# Patient Record
Sex: Female | Born: 1935 | Race: Black or African American | Hispanic: No | Marital: Single | Smoking: Never smoker
Health system: Southern US, Community
[De-identification: ages and names within clinical notes are randomized; demographics above are authoritative.]

## PROBLEM LIST (undated history)

## (undated) DIAGNOSIS — H409 Unspecified glaucoma: Secondary | ICD-10-CM

## (undated) DIAGNOSIS — I10 Essential (primary) hypertension: Secondary | ICD-10-CM

## (undated) DIAGNOSIS — M1990 Unspecified osteoarthritis, unspecified site: Secondary | ICD-10-CM

## (undated) DIAGNOSIS — E7800 Pure hypercholesterolemia, unspecified: Secondary | ICD-10-CM

## (undated) DIAGNOSIS — E0590 Thyrotoxicosis, unspecified without thyrotoxic crisis or storm: Secondary | ICD-10-CM

## (undated) HISTORY — DX: Essential (primary) hypertension: I10

## (undated) HISTORY — PX: CATARACT EXTRACTION: SUR2

## (undated) HISTORY — DX: Pure hypercholesterolemia, unspecified: E78.00

## (undated) HISTORY — DX: Thyrotoxicosis, unspecified without thyrotoxic crisis or storm: E05.90

---

## 2011-11-25 ENCOUNTER — Encounter (HOSPITAL_COMMUNITY): Payer: Self-pay | Admitting: *Deleted

## 2011-11-25 ENCOUNTER — Emergency Department (HOSPITAL_COMMUNITY): Payer: Medicare Other

## 2011-11-25 ENCOUNTER — Observation Stay (HOSPITAL_COMMUNITY): Payer: Medicare Other

## 2011-11-25 ENCOUNTER — Observation Stay (HOSPITAL_COMMUNITY)
Admission: EM | Admit: 2011-11-25 | Discharge: 2011-11-27 | Disposition: A | Discharge status: Home / Self Care | Payer: Medicare Other | Attending: Internal Medicine | Admitting: Internal Medicine

## 2011-11-25 DIAGNOSIS — R011 Cardiac murmur, unspecified: Secondary | ICD-10-CM | POA: Diagnosis present

## 2011-11-25 DIAGNOSIS — H409 Unspecified glaucoma: Secondary | ICD-10-CM | POA: Diagnosis present

## 2011-11-25 DIAGNOSIS — I079 Rheumatic tricuspid valve disease, unspecified: Secondary | ICD-10-CM | POA: Insufficient documentation

## 2011-11-25 DIAGNOSIS — R5383 Other fatigue: Secondary | ICD-10-CM | POA: Insufficient documentation

## 2011-11-25 DIAGNOSIS — R42 Dizziness and giddiness: Principal | ICD-10-CM | POA: Diagnosis present

## 2011-11-25 DIAGNOSIS — R110 Nausea: Secondary | ICD-10-CM | POA: Insufficient documentation

## 2011-11-25 DIAGNOSIS — R5381 Other malaise: Secondary | ICD-10-CM | POA: Insufficient documentation

## 2011-11-25 DIAGNOSIS — E0590 Thyrotoxicosis, unspecified without thyrotoxic crisis or storm: Secondary | ICD-10-CM | POA: Insufficient documentation

## 2011-11-25 DIAGNOSIS — I10 Essential (primary) hypertension: Secondary | ICD-10-CM | POA: Insufficient documentation

## 2011-11-25 DIAGNOSIS — J329 Chronic sinusitis, unspecified: Secondary | ICD-10-CM | POA: Insufficient documentation

## 2011-11-25 HISTORY — DX: Unspecified osteoarthritis, unspecified site: M19.90

## 2011-11-25 HISTORY — DX: Unspecified glaucoma: H40.9

## 2011-11-25 LAB — APTT: aPTT: 34 seconds (ref 24–37)

## 2011-11-25 LAB — HEPATIC FUNCTION PANEL
ALT: 12 U/L (ref 0–35)
Bilirubin, Direct: <0.1 mg/dL (ref 0.0–0.3)
Total Protein: 7.4 g/dL (ref 6.0–8.3)

## 2011-11-25 LAB — CBC WITH DIFFERENTIAL/PLATELET
Eosinophils Absolute: 0 10*3/uL (ref 0.0–0.7)
Eosinophils Relative: 1 % (ref 0–5)
Lymphs Abs: 0.8 10*3/uL (ref 0.7–4.0)
MCH: 27 pg (ref 26.0–34.0)
MCV: 81.4 fL (ref 78.0–100.0)
Monocytes Relative: 5 % (ref 3–12)
Platelets: 167 10*3/uL (ref 150–400)
RBC: 5.37 MIL/uL — ABNORMAL HIGH (ref 3.87–5.11)

## 2011-11-25 LAB — URINE MICROSCOPIC-ADD ON

## 2011-11-25 LAB — COMPREHENSIVE METABOLIC PANEL
BUN: 15 mg/dL (ref 6–23)
Calcium: 9.3 mg/dL (ref 8.4–10.5)
GFR calc Af Amer: >90 mL/min (ref >90)
Glucose, Bld: 138 mg/dL — ABNORMAL HIGH (ref 70–99)
Total Protein: 7.3 g/dL (ref 6.0–8.3)

## 2011-11-25 LAB — URINALYSIS, ROUTINE W REFLEX MICROSCOPIC
Nitrite: NEGATIVE
Specific Gravity, Urine: 1.014 (ref 1.005–1.030)
pH: 7.5 (ref 5.0–8.0)

## 2011-11-25 LAB — MAGNESIUM: Magnesium: 2.1 mg/dL (ref 1.5–2.5)

## 2011-11-25 LAB — PHOSPHORUS: Phosphorus: 2.5 mg/dL (ref 2.3–4.6)

## 2011-11-25 LAB — PROTIME-INR: INR: 0.98 (ref 0.00–1.49)

## 2011-11-25 MED ORDER — BRIMONIDINE TARTRATE 0.1 % OP SOLN: 1.0000 [drp] | Freq: Two times a day (BID) | OPHTHALMIC | Status: DC

## 2011-11-25 MED ORDER — HYDRALAZINE HCL 20 MG/ML IJ SOLN: 10.0000 mg | Freq: Three times a day (TID) | INTRAMUSCULAR | Status: DC | PRN

## 2011-11-25 MED ORDER — SODIUM CHLORIDE 0.9 % IV SOLN
INTRAVENOUS | Status: DC
  Administered 2011-11-25: 21:00:00 via INTRAVENOUS
  Administered 2011-11-26: 20 mL/h via INTRAVENOUS

## 2011-11-25 MED ORDER — TRAVOPROST (BAK FREE) 0.004 % OP SOLN
1.0000 [drp] | Freq: Every day | OPHTHALMIC | Status: DC
  Administered 2011-11-25 – 2011-11-26 (×2): 1 [drp] via OPHTHALMIC
  Filled 2011-11-25: qty 2.5

## 2011-11-25 MED ORDER — LISINOPRIL 10 MG PO TABS: 10.0000 mg | ORAL_TABLET | Freq: Every day | ORAL | Status: DC

## 2011-11-25 MED ORDER — AMLODIPINE BESYLATE 5 MG PO TABS
5.0000 mg | ORAL_TABLET | Freq: Every day | ORAL | Status: DC
  Administered 2011-11-25 – 2011-11-26 (×2): 5 mg via ORAL
  Filled 2011-11-25 (×2): qty 1

## 2011-11-25 MED ORDER — BRIMONIDINE TARTRATE 0.15 % OP SOLN
1.0000 [drp] | Freq: Two times a day (BID) | OPHTHALMIC | Status: DC
  Administered 2011-11-25 – 2011-11-27 (×4): 1 [drp] via OPHTHALMIC
  Filled 2011-11-25: qty 5

## 2011-11-25 MED ORDER — ENOXAPARIN SODIUM 40 MG/0.4ML ~~LOC~~ SOLN
40.0000 mg | SUBCUTANEOUS | Status: DC
  Administered 2011-11-25 – 2011-11-26 (×2): 40 mg via SUBCUTANEOUS
  Filled 2011-11-25 (×3): qty 0.4

## 2011-11-25 MED ORDER — ACETAMINOPHEN 325 MG PO TABS: 650.0000 mg | ORAL_TABLET | Freq: Four times a day (QID) | ORAL | Status: DC | PRN

## 2011-11-25 MED ORDER — MECLIZINE HCL 25 MG PO TABS
25.0000 mg | ORAL_TABLET | Freq: Once | ORAL | Status: AC
  Administered 2011-11-25: 25 mg via ORAL
  Filled 2011-11-25: qty 1

## 2011-11-25 MED ORDER — MECLIZINE HCL 50 MG PO TABS: 25.0000 mg | ORAL_TABLET | Freq: Three times a day (TID) | ORAL | Status: AC | PRN

## 2011-11-25 MED ORDER — ONDANSETRON HCL 4 MG/2ML IJ SOLN: 4.0000 mg | Freq: Four times a day (QID) | INTRAMUSCULAR | Status: DC | PRN

## 2011-11-25 MED ORDER — ACETAMINOPHEN 650 MG RE SUPP: 650.0000 mg | Freq: Four times a day (QID) | RECTAL | Status: DC | PRN

## 2011-11-25 MED ORDER — LABETALOL HCL 5 MG/ML IV SOLN
10.0000 mg | Freq: Once | INTRAVENOUS | Status: AC
Start: 2011-11-25 — End: 2011-11-25
  Administered 2011-11-25: 10 mg via INTRAVENOUS
  Filled 2011-11-25: qty 4

## 2011-11-25 MED ORDER — ASPIRIN EC 81 MG PO TBEC
81.0000 mg | DELAYED_RELEASE_TABLET | Freq: Every day | ORAL | Status: DC
  Administered 2011-11-26 – 2011-11-27 (×2): 81 mg via ORAL
  Filled 2011-11-25 (×2): qty 1

## 2011-11-25 MED ORDER — DORZOLAMIDE HCL-TIMOLOL MAL 2-0.5 % OP SOLN
1.0000 [drp] | Freq: Two times a day (BID) | OPHTHALMIC | Status: DC
  Administered 2011-11-25 – 2011-11-27 (×4): 1 [drp] via OPHTHALMIC
  Filled 2011-11-25: qty 10

## 2011-11-25 MED ORDER — ONDANSETRON HCL 4 MG PO TABS: 4.0000 mg | ORAL_TABLET | Freq: Four times a day (QID) | ORAL | Status: DC | PRN

## 2011-11-25 MED ORDER — CARBOXYMETHYLCELLULOSE SODIUM 0.5 % OP SOLN: 1.0000 [drp] | Freq: Three times a day (TID) | OPHTHALMIC | Status: DC | PRN

## 2011-11-25 MED ORDER — LABETALOL HCL 5 MG/ML IV SOLN
10.0000 mg | Freq: Once | INTRAVENOUS | Status: AC
  Administered 2011-11-25: 10 mg via INTRAVENOUS
  Filled 2011-11-25: qty 4

## 2011-11-25 MED ORDER — POLYVINYL ALCOHOL 1.4 % OP SOLN
1.0000 [drp] | Freq: Three times a day (TID) | OPHTHALMIC | Status: DC | PRN
  Administered 2011-11-25: 1 [drp] via OPHTHALMIC
  Filled 2011-11-25: qty 15

## 2011-11-25 NOTE — ED Notes (Signed)
ZOX:[id number]<AV> Expected date:<BR> Expected time: 8:41 AM<BR> Means of arrival:Ambulance<BR> Comments:<BR> Elderly, weakness

## 2011-11-25 NOTE — ED Notes (Signed)
In process of discharging pt, pt sat up, stated dizzy again, family states pt does not need to be discharged, states "she is not herself, she needs to atleast stay over night". EDP to be made aware.

## 2011-11-25 NOTE — ED Provider Notes (Addendum)
History     CSN: [id number]  Arrival date & time 11/25/11  0865   First MD Initiated Contact with Patient 11/25/11 [phone]      Chief Complaint  Patient presents with  . Weakness  . Emesis  . Nausea    (Consider location/radiation/quality/duration/timing/severity/associated sxs/prior treatment) HPI Pt states she felt at her baseline last night. She woke this AM and got out of bed. States she feels the bed was moving. She sat back down and she began to feel better. States she now feels generally weak. No fever, chills, HA, visual changes, CP, SOB, abd pain, N/V/D or urinary symptoms. Pt has history of HTN but was taken off BP meds Past Medical History  Diagnosis Date  . Arthritis     knees  . Glaucoma     Past Surgical History  Procedure Date  . Cataract extraction     History reviewed. No pertinent family history.  History  Substance Use Topics  . Smoking status: Never Smoker   . Smokeless tobacco: Never Used  . Alcohol Use: No    OB History    Grav Para Term Preterm Abortions TAB SAB Ect Mult Living                  Review of Systems  Constitutional: Negative for fever and chills.  HENT: Negative for neck pain.   Eyes: Negative for visual disturbance.  Respiratory: Negative for shortness of breath.   Cardiovascular: Negative for chest pain.  Gastrointestinal: Negative for nausea, vomiting and abdominal pain.  Genitourinary: Negative for dysuria, frequency and flank pain.  Musculoskeletal: Negative for back pain.  Skin: Negative for rash and wound.  Neurological: Positive for dizziness, weakness and light-headedness. Negative for syncope, numbness and headaches.    Allergies  Review of patient's allergies indicates no known allergies.  Home Medications   Current Outpatient Rx  Name Route Sig Dispense Refill  . BRIMONIDINE TARTRATE 0.1 % OP SOLN Both Eyes Place 1 drop into both eyes 2 (two) times daily.    Marland Kitchen CARBOXYMETHYLCELLULOSE SODIUM 0.5 % OP SOLN  Both Eyes Place 1 drop into both eyes 3 (three) times daily as needed.    . DORZOLAMIDE HCL-TIMOLOL MAL 22.3-6.8 MG/ML OP SOLN Both Eyes Place 1 drop into both eyes 2 (two) times daily.    . TRAVOPROST (BAK FREE) 0.004 % OP SOLN Both Eyes Place 1 drop into both eyes at bedtime.    Marland Kitchen LISINOPRIL 10 MG PO TABS Oral Take 1 tablet (10 mg total) by mouth daily. 30 tablet 2  . MECLIZINE HCL 50 MG PO TABS Oral Take 0.5 tablets (25 mg total) by mouth 3 (three) times daily as needed. 30 tablet 0    BP 160/60  Pulse 59  Temp 97.4 F (36.3 C) (Oral)  Resp 21  SpO2 99%  Physical Exam  Nursing note and vitals reviewed. Constitutional: She is oriented to person, place, and time. She appears well-developed and well-nourished. No distress.  HENT:  Head: Normocephalic and atraumatic.  Mouth/Throat: Oropharynx is clear and moist.  Eyes: EOM are normal. Pupils are equal, round, and reactive to light.       No nystagmus  Neck: Normal range of motion. Neck supple.  Cardiovascular: Normal rate and regular rhythm.   Murmur heard. Pulmonary/Chest: Effort normal and breath sounds normal. No respiratory distress. She has no wheezes. She has no rales.  Abdominal: Soft. Bowel sounds are normal. There is no tenderness. There is no rebound and no  guarding.  Musculoskeletal: Normal range of motion. She exhibits no edema and no tenderness.  Neurological: She is alert and oriented to person, place, and time.       5/5 motor in all ext, sensation intact, finger to nose intact  Skin: Skin is warm and dry. No rash noted. No erythema.  Psychiatric: She has a normal mood and affect. Her behavior is normal.    ED Course  Procedures (including critical care time)  Labs Reviewed  GLUCOSE, CAPILLARY - Abnormal; Notable for the following:    Glucose-Capillary 110 (*)     All other components within normal limits  CBC WITH DIFFERENTIAL - Abnormal; Notable for the following:    RBC 5.37 (*)     Neutrophils Relative 79  (*)     All other components within normal limits  COMPREHENSIVE METABOLIC PANEL - Abnormal; Notable for the following:    Glucose, Bld 138 (*)     GFR calc non Af Amer 80 (*)     All other components within normal limits  URINALYSIS, ROUTINE W REFLEX MICROSCOPIC - Abnormal; Notable for the following:    Glucose, UA 100 (*)     Leukocytes, UA TRACE (*)     All other components within normal limits  URINE MICROSCOPIC-ADD ON - Abnormal; Notable for the following:    Bacteria, UA FEW (*)     All other components within normal limits  PROTIME-INR  APTT  POCT I-STAT TROPONIN I   Ct Head Wo Contrast  11/25/2011  *RADIOLOGY REPORT*  Clinical Data: Dizziness and weakness.  CT HEAD WITHOUT CONTRAST  Technique:  Contiguous axial images were obtained from the base of the skull through the vertex without contrast.  Comparison: None.  Findings: Bone windows demonstrate minimal mucosal thickening of the maxillary sinuses.  Hypoplastic frontal sinuses.  Partial opacification of the inferior aspect of the left mastoid air cells; image 4 of series 3.  Soft tissue windows demonstrate normal cerebral volume for age. Mild low density in the periventricular white matter likely related to small vessel disease. No  mass lesion, hemorrhage, hydrocephalus, acute infarct, intra-axial, or extra-axial fluid collection.  IMPRESSION:  1. No acute intracranial abnormality. 2.  Relatively mild for age small vessel ischemic change. 3.  Fluid in the left mastoid air cells.   Original Report Authenticated By: Consuello Bossier, M.D.      1. Hypertension   2. Vertigo      Date: 11/25/2011  Rate: 61  Rhythm: normal sinus rhythm  QRS Axis: normal  Intervals: QRS prolonged  ST/T Wave abnormalities: nonspecific T wave changes  Conduction Disutrbances:right bundle branch block  Narrative Interpretation:   Old EKG Reviewed: none available    MDM  Pt with persistent dizziness when sitting up and nausea. Will MRI for  possible post fossa CVA. Discussed with [facility] who will admit to obs.        Loren Racer, MD 11/25/11 1544  Loren Racer, MD 11/25/11 [phone]

## 2011-11-25 NOTE — Progress Notes (Signed)
Carol Munoz, is a 78 y.o. female,   MRN: [id number]  -  DOB - 08-Jun-1924  Outpatient Primary MD for the patient is No primary provider on file.  in for    Chief Complaint  Patient presents with  . Weakness  . Emesis  . Nausea     Blood pressure 166/73, pulse 61, temperature 97.4 F (36.3 C), temperature source Oral, resp. rate 15, SpO2 100.00%.  Principal Problem:  *Vertigo  78 yo female with virtually no medical hx presents to ED w/cc that she "does not feel well" reports sensation bed moving this am. States that when she moves she feels dizzy. No CP,palpitation, sob, fever, chills. No recent nausea/vomiting. No diarrhea  Work up yields BP 210/63 initially, other vitals WNL, CT head no acute intracranial abnormality.  Relatively mild for age small vessel ischemic change.  Fluid in the left mastoid air cells.  Pt given labetalol x2 in ED, and meclizine. Symptoms improved. About to discharge from ED and pt complained of dizziness again and family concerned that she not herself. Getting MRI  Have admitted for obs to tele, if MRI neg can change to MS.

## 2011-11-25 NOTE — ED Notes (Signed)
Pt states she woke up this morning "not feeling like herself", states she feels weak and dizzy, states felt fine before going to bed last night, denies pain, shortness of breath or numbness/tingling, states has not put her eye drops in this morning so her vision is blurred d/t that, that is not new. Pt states she has not eaten anything today. Pt denies having nausea and vomiting at this time, had nausea and vomiting when arrived with ems. Pt a/o x 4, follows all commands appropriately.

## 2011-11-25 NOTE — ED Notes (Signed)
Patient transported to MRI 

## 2011-11-25 NOTE — H&P (Signed)
[facility] History and Physical  Carol Munoz ZOX:[id number] DOB: 04/04/1924 DOA: 11/25/2011  Referring physician: Dr. Ranae Palms PCP: No primary provider on file.   Chief Complaint: generalized weakness, dizziness and nausea.  HPI: Carol Munoz is a 78 y.o. female with pmh significant for HTN and glaucoma who came to the hospital complaining of dizziness and generalized weakness with associated nausea. Patient reports some dizziness when changing position after waking up on the day of admission; subsequently she felt weak and reports some nausea, but no vomiting. Patient was feeling so bad that she decide to call 911. Paramedics arrived and patient came to ED; while in ED she was found with BP of 210/102 that responded to 2 doses of labetalol; patient had CT scan and MRI w/o acute abnormalities; plan was to discharge patient from ED; but she felt dizzy again and per family was acting different. [facility] was consulted to admit patient for further evaluation and tx.  Patient denies CP, SOB, abdominal pain, diarrhea, hematemesis, melena, HA, blurred vision, fever, chills and/or cough.  Review of Systems: negative except as mentioned on HPI.  Past Medical History  Diagnosis Date  . Arthritis     knees  . Glaucoma    Past Surgical History  Procedure Date  . Cataract extraction    Social History:  reports that she has never smoked. She has never used smokeless tobacco. She reports that she does not drink alcohol or use illicit drugs. Patient came from home and was able to perform ADL's w/o assistance  No Known Allergies  History reviewed. No pertinent family history. per patient just hx of HTN on her parents.   Prior to Admission medications   Medication Sig Start Date End Date Taking? Authorizing Provider  brimonidine (ALPHAGAN P) 0.1 % SOLN Place 1 drop into both eyes 2 (two) times daily.   Yes Historical Provider, MD  carboxymethylcellulose (REFRESH PLUS) 0.5 % SOLN Place 1 drop into  both eyes 3 (three) times daily as needed.   Yes Historical Provider, MD  dorzolamide-timolol (COSOPT) 22.3-6.8 MG/ML ophthalmic solution Place 1 drop into both eyes 2 (two) times daily.   Yes Historical Provider, MD  Travoprost, BAK Free, (TRAVATAN) 0.004 % SOLN ophthalmic solution Place 1 drop into both eyes at bedtime.   Yes Historical Provider, MD  lisinopril (PRINIVIL) 10 MG tablet Take 1 tablet (10 mg total) by mouth daily. 11/25/11 11/24/12  Loren Racer, MD  meclizine (ANTIVERT) 50 MG tablet Take 0.5 tablets (25 mg total) by mouth 3 (three) times daily as needed. 11/25/11 12/05/11  Loren Racer, MD   Physical Exam: Filed Vitals:   11/25/11 1530 11/25/11 1615 11/25/11 1744 11/25/11 1900  BP: 160/60 166/73 158/49 173/47  Pulse: 59 61 83 73  Temp:   98.7 F (37.1 C) 98 F (36.7 C)  TempSrc:   Oral   Resp: 21 15 25 20   Height:    5\' 3"  (1.6 m)  Weight:    60.283 kg (132 lb 14.4 oz)  SpO2: 99% 100% 100% 95%     General:NAD; AAOX3, cooperative to examination  Eyes: PERRLA, no icterus, no nystagmus; EOMI  ENT: MMM; no erythema or exudates inside her mouth  Neck: no bruits  Cardiovascular: RRR; no rubs or gallops; positive SEM  Respiratory: CTA bilaterally  Abdomen: soft, NT, ND positive BS  Skin: no ras or petechiae  Musculoskeletal: no joint swelling or erythema; FROM  Psychiatric: no hallucinations or SI; appropriate  Neurologic: AAOX3; non focal deficit; CN intact,  2+ DTR; normal finger to nose; MS 5/5 bilaterally.  Labs on Admission:  Basic Metabolic Panel:  Lab 11/25/11 0454  NA 136  K 3.8  CL 102  CO2 24  GLUCOSE 138*  BUN 15  CREATININE 0.63  CALCIUM 9.3  MG --  PHOS --   Liver Function Tests:  Lab 11/25/11 0950  AST 18  ALT 11  ALKPHOS 64  BILITOT 0.4  PROT 7.3  ALBUMIN 3.9   CBC:  Lab 11/25/11 0950  WBC 5.0  NEUTROABS 3.9  HGB 14.5  HCT 43.7  MCV 81.4  PLT 167   CBG:  Lab 11/25/11 0917  GLUCAP 110*    Radiological Exams  on Admission: Ct Head Wo Contrast  11/25/2011  *RADIOLOGY REPORT*  Clinical Data: Dizziness and weakness.  CT HEAD WITHOUT CONTRAST  Technique:  Contiguous axial images were obtained from the base of the skull through the vertex without contrast.  Comparison: None.  Findings: Bone windows demonstrate minimal mucosal thickening of the maxillary sinuses.  Hypoplastic frontal sinuses.  Partial opacification of the inferior aspect of the left mastoid air cells; image 4 of series 3.  Soft tissue windows demonstrate normal cerebral volume for age. Mild low density in the periventricular white matter likely related to small vessel disease. No  mass lesion, hemorrhage, hydrocephalus, acute infarct, intra-axial, or extra-axial fluid collection.  IMPRESSION:  1. No acute intracranial abnormality. 2.  Relatively mild for age small vessel ischemic change. 3.  Fluid in the left mastoid air cells.   Original Report Authenticated By: Consuello Bossier, M.D.    Mr Brain Wo Contrast  11/25/2011  *RADIOLOGY REPORT*  Clinical Data: 78 year old female with vertigo.  Dizziness and weakness.  MRI HEAD WITHOUT CONTRAST  Technique:  Multiplanar, multiecho pulse sequences of the brain and surrounding structures were obtained according to standard protocol without intravenous contrast.  Comparison: Head CT without contrast 11/25/2011.  Findings: No restricted diffusion to suggest acute infarction.  No ventriculomegaly. No midline shift, mass effect, or evidence of mass lesion. No acute intracranial hemorrhage identified. Negative pituitary and cervicomedullary junction. 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  Widespread patchy and confluent cerebral white matter T2 and FLAIR hyperintensity. No areas of cortical encephalomalacia identified.  Deep gray matter nuclei and brain stem within normal limits.  There may be a tiny chronic lacunar infarct in the left cerebellar  hemisphere (series 5 image 5).  Negative visualized cervical spine.  Normal marrow signal. Postoperative changes to the right globe.  Minimal paranasal sinus mucosal thickening. Negative scalp soft tissues. Left mastoid air cell fluid, more so inferiorly.  Negative visualized nasopharynx. Right mastoids are clear.  Asymmetric right petrous apex pneumatization incidentally noted. Other visualized internal auditory structures appear grossly normal.  IMPRESSION: 1. No acute intracranial abnormality. 2.  Moderate nonspecific white matter changes, favor related to chronic small vessel disease. 3.  Left mastoid effusion, significance doubtful.   Original Report Authenticated By: Ulla Potash III, M.D.     EKG: regular rate; normal PR interval and axis; no acute ischemic changes.  Assessment/Plan: 1-Vertigo: most likely BPV vs TIA due to uncontrolled malignant HTN. Will ask PT to evaluate for vestibular problems and also start prn meclizine. CT and MRI negative for acute abnormalities. Will follow VS and start tx for HTN. Will use ASA 81 mg for secondary prevention given concern for TIA.  2-HTN (hypertension), malignant:will start tx with norvasc; will also start low sodium  diet. PRN hydralazine for SBP > 185 or DBP > 105.  3-Nausea:might be associated with BPV; will start PRN antiemetics.  4-Glaucoma:continue eye drops. No vision changes reported per patient.  5-Systolic murmur:most likely 2/2 uncontrolled HTN and aortic stenosis. Will check 2-D echo.  DVT: lovenox.   Code Status: Full Family Communication: whole family at bedside Disposition Plan: home when medically stable  Time spent: >30 minutes  Carol Munoz [facility] Pager [phone]  If 7PM-7AM, please contact night-coverage [email or web address] Password [facility] 11/25/2011, 7:19 PM

## 2011-11-25 NOTE — ED Notes (Signed)
Pt ambulated to restroom w/o difficulty, no dizziness present.

## 2011-11-25 NOTE — ED Notes (Signed)
Per EMS pt from home, woke up this morning "not feeling herself", feeling weak, dizzy when stood up, negative for orthostatics, nausea and vomiting when pulled in parking lot, denies any new pain, has arthritis pain. A/o. BP 138/72, HR 74, CBG 111

## 2011-11-26 DIAGNOSIS — J329 Chronic sinusitis, unspecified: Secondary | ICD-10-CM | POA: Diagnosis present

## 2011-11-26 DIAGNOSIS — E0590 Thyrotoxicosis, unspecified without thyrotoxic crisis or storm: Secondary | ICD-10-CM | POA: Diagnosis present

## 2011-11-26 LAB — CBC
Hemoglobin: 12.9 g/dL (ref 12.0–15.0)
MCH: 26.7 pg (ref 26.0–34.0)
Platelets: 157 10*3/uL (ref 150–400)
RBC: 4.83 MIL/uL (ref 3.87–5.11)
WBC: 6.1 10*3/uL (ref 4.0–10.5)

## 2011-11-26 LAB — LIPID PANEL
Total CHOL/HDL Ratio: 3.9 RATIO
VLDL: 24 mg/dL (ref 0–40)

## 2011-11-26 LAB — BASIC METABOLIC PANEL
CO2: 26 mEq/L (ref 19–32)
Calcium: 9 mg/dL (ref 8.4–10.5)
Chloride: 109 mEq/L (ref 96–112)
Glucose, Bld: 99 mg/dL (ref 70–99)
Potassium: 3.1 mEq/L — ABNORMAL LOW (ref 3.5–5.1)
Sodium: 143 mEq/L (ref 135–145)

## 2011-11-26 LAB — TSH: TSH: 0.209 u[IU]/mL — ABNORMAL LOW (ref 0.350–4.500)

## 2011-11-26 MED ORDER — AZITHROMYCIN 500 MG PO TABS
500.0000 mg | ORAL_TABLET | Freq: Every day | ORAL | Status: AC
  Administered 2011-11-26: 500 mg via ORAL
  Filled 2011-11-26: qty 1

## 2011-11-26 MED ORDER — AZITHROMYCIN 250 MG PO TABS
250.0000 mg | ORAL_TABLET | Freq: Every day | ORAL | Status: DC
  Administered 2011-11-27: 250 mg via ORAL
  Filled 2011-11-26: qty 1

## 2011-11-26 MED ORDER — AMLODIPINE BESYLATE 10 MG PO TABS
10.0000 mg | ORAL_TABLET | Freq: Every day | ORAL | Status: DC
  Administered 2011-11-27: 10 mg via ORAL
  Filled 2011-11-26: qty 1

## 2011-11-26 NOTE — Evaluation (Signed)
Physical Therapy Evaluation Patient Details Name: Carol Munoz MRN: [id number] DOB: Aug 06, 1924 Today's Date: 11/26/2011 Time: 6295-2841 PT Time Calculation (min): 27 min  PT Assessment / Plan / Recommendation Clinical Impression  Pt presents with vertigo and nausea with history of HTN and glaucoma.  Pt has normal smooth persuits, no saccades noted during head thrust test and no nystagmus or c/o dizziness with head turns side to side or getting in/out of bed.  No evidence of peripheral/central vestibular issues noted other than some mild balance deficits. Ambulated with pt in hallway with no AD with some noted instability/imbalance.  Pt will benefit from skilled PT in acute venue to address deficits.  PT recommends OP Neurorehabilitation for balance and fall prevention training.  Provided pt with script to fill out with PCP.      PT Assessment  Patient needs continued PT services    Follow Up Recommendations  Outpatient PT    Barriers to Discharge None      Equipment Recommendations  None recommended by PT    Recommendations for Other Services     Frequency Min 3X/week    Precautions / Restrictions Precautions Precautions: Fall Restrictions Weight Bearing Restrictions: No   Pertinent Vitals/Pain No pain      Mobility  Bed Mobility Bed Mobility: Supine to Sit Supine to Sit: 7: Independent Transfers Transfers: Sit to Stand;Stand to Sit Sit to Stand: With upper extremity assist;From bed;6: Modified independent (Device/Increase time) Stand to Sit: 6: Modified independent (Device/Increase time);With upper extremity assist;To bed;With armrests;To chair/3-in-1 Details for Transfer Assistance: Performed transfer x 2 at Mod I level for increased time Ambulation/Gait Ambulation/Gait Assistance: 4: Min guard;4: Min assist Ambulation Distance (Feet): 400 Feet Assistive device: None Ambulation/Gait Assistance Details: Cues for safety with ambulation.  Min guard to min assist for noted  intermittent instability/LOB.   Gait Pattern: Step-through pattern Gait velocity: WFL Stairs: Yes Stairs Assistance: 4: Min assist Stairs Assistance Details (indicate cue type and reason): cues for sequencing/technique.  Stair Management Technique: One rail Right;Forwards;Step to pattern Number of Stairs: 3  Wheelchair Mobility Wheelchair Mobility: No    Exercises     PT Diagnosis: Difficulty walking  PT Problem List: Decreased balance;Decreased mobility PT Treatment Interventions: Gait training;Stair training;Functional mobility training;Therapeutic activities;Therapeutic exercise;Balance training;Patient/family education   PT Goals Acute Rehab PT Goals PT Goal Formulation: With patient Time For Goal Achievement: 12/03/11 Potential to Achieve Goals: Good Pt will Ambulate: Independently;51 - 150 feet PT Goal: Ambulate - Progress: Goal set today Pt will Go Up / Down Stairs: 3-5 stairs;with modified independence;with rail(s) PT Goal: Up/Down Stairs - Progress: Goal set today Additional Goals Additional Goal #1: Score 16/19 on DGI PT Goal: Additional Goal #1 - Progress: Goal set today  Visit Information  Last PT Received On: 11/26/11 Assistance Needed: +1    Subjective Data  Subjective: Yes I was feeling dizzy, but better now Patient Stated Goal: to go home.    Prior Functioning  Home Living Lives With: Alone Available Help at Discharge: Family;Available PRN/intermittently Type of Home: House Home Access: Stairs to enter [employer] of Steps: 5 Entrance Stairs-Rails: Right;Left Home Layout: One level Bathroom Shower/Tub: Engineer, manufacturing systems: Handicapped height Home Adaptive Equipment: Straight cane;Grab bars in shower Prior Function Level of Independence: Independent Able to Take Stairs?: Yes Driving: Yes Vocation: Retired Musician: No difficulties    Cognition  Overall Cognitive Status: Appears within functional limits  for tasks assessed/performed Arousal/Alertness: Awake/alert Orientation Level: Appears intact for tasks assessed Behavior During  Session: [facility] for tasks performed    Extremity/Trunk Assessment Right Lower Extremity Assessment RLE ROM/Strength/Tone: WFL for tasks assessed RLE Sensation: WFL - Light Touch RLE Coordination: WFL - gross/fine motor Left Lower Extremity Assessment LLE ROM/Strength/Tone: WFL for tasks assessed LLE Sensation: WFL - Light Touch LLE Coordination: WFL - gross/fine motor Trunk Assessment Trunk Assessment: Normal   Balance Balance Balance Assessed: Yes Standardized Balance Assessment Standardized Balance Assessment: Vestibular Evaluation;Dynamic Gait Index Dynamic Gait Index Level Surface: Mild Impairment Change in Gait Speed: Mild Impairment Gait with Horizontal Head Turns: Mild Impairment Gait with Vertical Head Turns: Moderate Impairment Gait and Pivot Turn: Moderate Impairment Step Over Obstacle: Moderate Impairment Step Around Obstacles: Mild Impairment Steps: Moderate Impairment Total Score: 12   End of Session PT - End of Session Equipment Utilized During Treatment: Gait belt Activity Tolerance: Patient tolerated treatment well Patient left: in chair;with call bell/phone within reach;with family/visitor present Nurse Communication: Mobility status  GP Functional Assessment Tool Used: DGI and clinical judgement.  Functional Limitation: Mobility: Walking and moving around Mobility: Walking and Moving Around Current Status [phone]): At least 20 percent but less than 40 percent impaired, limited or restricted Mobility: Walking and Moving Around Goal Status [phone]): At least 1 percent but less than 20 percent impaired, limited or restricted   Lessie Dings 11/26/2011, 12:41 PM

## 2011-11-26 NOTE — Progress Notes (Signed)
Nutrition Brief Note  Patient identified on the Malnutrition Screening Tool (MST) report for unintended weight loss, generating a score of 2.   Body mass index is 23.54 kg/(m^2). Pt meets criteria for healthy normal weight based on current BMI.   Current diet order is heart healthy, patient is consuming approximately 75% of meals at this time. Labs and medications reviewed. Pt reports eating well PTA, 3 meals/day and snacks. Pt reports some weight loss in the past year r/t pt eating healthier and cutting back on junk food. Pt states she went from a size 14 to a size 12. Pt reports she had some nausea/vomiting on admission which has resolved.   No nutrition interventions warranted at this time. If nutrition issues arise, please consult RD.   Levon Hedger MS, RD, LDN [phone] Pager [phone] After Hours Pager

## 2011-11-26 NOTE — Progress Notes (Signed)
  Echocardiogram 2D Echocardiogram has been performed.  Carol Munoz 11/26/2011, 10:28 AM

## 2011-11-26 NOTE — Progress Notes (Signed)
[facility] PROGRESS NOTE  Carol Munoz UJW:[id number] DOB: 1924-10-27 DOA: 11/25/2011 PCP: No primary provider on file.  Assessment/Plan: Principal Problem:  *Vertigo Active Problems:  HTN (hypertension), malignant  Nausea  Glaucoma  Systolic murmur    1. Vertigo: Patient presented with vertigo, and nausea, precipitated/exacerbated by movement. Differential diagnostic considerations included BPV vs TIA vs hypertensive encephalopathy, due to uncontrolled malignant HTN. BP was 210/102 on initial evaluation. Head CT and brain MRI were negative for  negative for acute abnormalities. She was managed with Meclizine, underwent vestibular rehab, by PT/OT, and is a symptomatic as of 11/26/11. Placed on ASA 81 mg for secondary prevention of CVA. Vascular duplex and 2D Echocardiogram are pending. .  2. HTN (hypertension), malignant: As described above, BP was markedly elevated at presentation, and was likely contributory to presenting symptomatology. This was addressed with iv Labetalol, followed by scheduled Norvasc, and has vastly improved. Will increase Norvasc further, to 10 mg daily, today. On PRN Hydralazine for SBP > 185 or DBP > 105.  3. Glaucoma: Not problematic. Continued on pre-admission eye drops.  5. Systolic murmur: This was an incidental finding on physical exam. Evaluation with 2-D echo. 6. Acute Sinusitis: Fluid-filled left mastoid sinus, was revealed on brain imaging studies. We shall address with a course of Azithromycin.  7. Dysthyroidism: TSH is low at 0.209, consistent with hyperthyroidism. We shall check T3 and T4. As patient has a bradycardia of 66/min at this time, we shall not utilize beta-blockade. She will need outpatient follow up.    Code Status: Full Code.  Family Communication:  Disposition Plan: To be determined.    Brief narrative: 78 y.o. female with pmh significant for HTN, OA and glaucoma who presented with dizziness and generalized weakness with associated  nausea, but without vomiting, commencing when changing position after waking up on the day of admission. Patient was feeling so bad, that she decide to call 911. Paramedics arrived and patient came to ED; while in ED she was found with BP of 210/102 that responded to 2 doses of iv Labetalol. Head CT scan and brain MRI showed no acute abnormalities. The plan was to discharge patient from ED; but she felt dizzy again and per family, was acting different. [facility] was consulted to admit patient for further evaluation and treatment.      Consultants:  N/A.  Procedures:  Head CT scan.  Brain MRI.   Antibiotics:  N/A.   HPI/Subjective: Asymptomatic.   Objective: Vital signs in last 24 hours: Temp:  [97.7 F (36.5 C)-98.7 F (37.1 C)] 98.2 F (36.8 C) (09/12 5621) Pulse Rate:  [59-83] 66  (09/12 0638) Resp:  [13-25] 16  (09/12 0638) BP: (148-173)/(46-73) 148/46 mmHg (09/12 0638) SpO2:  [95 %-100 %] 100 % (09/12 3086) Weight:  [60.283 kg (132 lb 14.4 oz)] 60.283 kg (132 lb 14.4 oz) (09/11 1900) Weight change:  Last BM Date: 11/25/11  Intake/Output from previous day: 09/11 0701 - 09/12 0700 In: 204 [I.V.:204] Out: -  Total I/O In: 240 [P.O.:240] Out: -    Physical Exam: General: Comfortable, alert, communicative, fully oriented, not short of breath at rest.  HEENT:  No clinical pallor, no jaundice, no conjunctival injection or discharge. Hydration is satisfactory.  NECK:  Supple, JVP not seen, no carotid bruits, no palpable lymphadenopathy, no palpable goiter. CHEST:  Clinically clear to auscultation, no wheezes, no crackles. HEART:  Sounds 1 and 2 heard, normal, regular, systolic murmurs. ABDOMEN:  Full, soft, non-tender, no palpable organomegaly, no palpable  masses, normal bowel sounds. GENITALIA:  Not examined. LOWER EXTREMITIES:  No pitting edema, palpable peripheral pulses. MUSCULOSKELETAL SYSTEM:  Generalized osteoarthritic changes, otherwise, normal. CENTRAL NERVOUS  SYSTEM:  No focal neurologic deficit on gross examination.  Lab Results:  Basename 11/26/11 0352 11/25/11 0950  WBC 6.1 5.0  HGB 12.9 14.5  HCT 39.5 43.7  PLT 157 167    Basename 11/26/11 0352 11/25/11 0950  NA 143 136  K 3.1* 3.8  CL 109 102  CO2 26 24  GLUCOSE 99 138*  BUN 12 15  CREATININE 0.70 0.63  CALCIUM 9.0 9.3   No results found for this or any previous visit (from the past 240 hour(s)).   Studies/Results: Ct Head Wo Contrast  11/25/2011  *RADIOLOGY REPORT*  Clinical Data: Dizziness and weakness.  CT HEAD WITHOUT CONTRAST  Technique:  Contiguous axial images were obtained from the base of the skull through the vertex without contrast.  Comparison: None.  Findings: Bone windows demonstrate minimal mucosal thickening of the maxillary sinuses.  Hypoplastic frontal sinuses.  Partial opacification of the inferior aspect of the left mastoid air cells; image 4 of series 3.  Soft tissue windows demonstrate normal cerebral volume for age. Mild low density in the periventricular white matter likely related to small vessel disease. No  mass lesion, hemorrhage, hydrocephalus, acute infarct, intra-axial, or extra-axial fluid collection.  IMPRESSION:  1. No acute intracranial abnormality. 2.  Relatively mild for age small vessel ischemic change. 3.  Fluid in the left mastoid air cells.   Original Report Authenticated By: Consuello Bossier, M.D.    Mr Brain Wo Contrast  11/25/2011  *RADIOLOGY REPORT*  Clinical Data: 78 year old female with vertigo.  Dizziness and weakness.  MRI HEAD WITHOUT CONTRAST  Technique:  Multiplanar, multiecho pulse sequences of the brain and surrounding structures were obtained according to standard protocol without intravenous contrast.  Comparison: Head CT without contrast 11/25/2011.  Findings: No restricted diffusion to suggest acute infarction.  No ventriculomegaly. No midline shift, mass effect, or evidence of mass lesion. No acute intracranial hemorrhage  identified. Negative pituitary and cervicomedullary junction. 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  Widespread patchy and confluent cerebral white matter T2 and FLAIR hyperintensity. No areas of cortical encephalomalacia identified.  Deep gray matter nuclei and brain stem within normal limits.  There may be a tiny chronic lacunar infarct in the left cerebellar hemisphere (series 5 image 5).  Negative visualized cervical spine.  Normal marrow signal. Postoperative changes to the right globe.  Minimal paranasal sinus mucosal thickening. Negative scalp soft tissues. Left mastoid air cell fluid, more so inferiorly.  Negative visualized nasopharynx. Right mastoids are clear.  Asymmetric right petrous apex pneumatization incidentally noted. Other visualized internal auditory structures appear grossly normal.  IMPRESSION: 1. No acute intracranial abnormality. 2.  Moderate nonspecific white matter changes, favor related to chronic small vessel disease. 3.  Left mastoid effusion, significance doubtful.   Original Report Authenticated By: Harley Hallmark, M.D.     Medications: Scheduled Meds:   . amLODipine  5 mg Oral Daily  . aspirin EC  81 mg Oral Daily  . brimonidine  1 drop Both Eyes BID  . dorzolamide-timolol  1 drop Both Eyes BID  . enoxaparin (LOVENOX) injection  40 mg Subcutaneous Q24H  . labetalol  10 mg Intravenous Once  . meclizine  25 mg Oral Once  . Travoprost (BAK Free)  1 drop Both Eyes QHS  .  DISCONTD: brimonidine  1 drop Both Eyes BID   Continuous Infusions:   . sodium chloride 20 mL/hr at 11/25/11 2118   PRN Meds:.acetaminophen, acetaminophen, hydrALAZINE, ondansetron (ZOFRAN) IV, ondansetron, polyvinyl alcohol, DISCONTD: carboxymethylcellulose    LOS: 1 day   Yennifer Segovia,CHRISTOPHER  [facility] Pager [phone]. If 8PM-8AM, please contact night-coverage at [email or web address], password [facility] 11/26/2011, 12:09 PM  LOS:  1 day

## 2011-11-26 NOTE — Discharge Summary (Signed)
Physician Discharge Summary  Carol Munoz WUJ:[id number] DOB: 04-17-1924 DOA: 11/25/2011  PCP: No primary provider on file.  Admit date: 11/25/2011 Discharge date: 11/27/2011  Recommendations for Outpatient Follow-up:  1. Follow up with primary MD. 2. Primary MD to monitor thyroid function. 3. Neurorehabilitation, for balance and fall prevention training.   Discharge Diagnoses:  Principal Problem:  *Vertigo Active Problems:  HTN (hypertension), malignant  Nausea  Glaucoma  Systolic murmur  Sinusitis  Hyperthyroidism   Discharge Condition: Satisfactory.   Diet recommendation: Heart-Healthy.   Filed Weights   11/25/11 1900  Weight: 60.283 kg (132 lb 14.4 oz)    History of present illness:  78 y.o. female with history of HTN, OA and glaucoma who presented with dizziness and generalized weakness with associated nausea, but without vomiting, commencing when changing position after waking up on the day of admission. Patient was feeling so bad, that she decide to call 911. Paramedics arrived and patient came to ED; while in ED she was found with BP of 210/102 that responded to 2 doses of iv Labetalol. Head CT scan and brain MRI showed no acute abnormalities. The plan was to discharge patient from ED; but she felt dizzy again and per family, was acting different. [facility] was consulted to admit patient for further evaluation and treatment.    Hospital Course:  1. Vertigo: Patient presented with vertigo, and nausea, precipitated/exacerbated by movement. Differential diagnostic considerations included BPV vs TIA vs hypertensive encephalopathy, due to uncontrolled malignant HTN. BP was 210/102 on initial evaluation. Head CT and brain MRI were negative for negative for acute abnormalities. She was managed with Meclizine, underwent vestibular rehab, by PT/OT, and was asymptomatic as of 11/26/11. Patient was placed on ASA 81 mg, for secondary prevention of CVA. 2D Echocardiogram showed normal left  ventricular cavity size, EF of 65% to 70% and no regional wall motion abnormalities. There was grade 1 diastolic dysfunction. Aortic valve mobility was trivially restricted, with very mild stenosis. Mild regurgitation. Valve area: 1.74cm^2(VTI). Valve area: 1.5cm^2 (Vmax). Valve area: 1.63cm^2 (Vmean). Left atrium was mildly to moderately dilated. PA peak pressure: 38mm Hg (S). Vascular dopplers showed no evidence of hemodynamically significant internal carotid artery stenosis bilateral. Vertebral arteries are patent with antegrade flow.  2. HTN (hypertension), malignant: As described above, BP was markedly elevated at presentation, and was likely contributory to presenting symptomatology. This was addressed with iv Labetalol, followed by scheduled Norvasc, with satisfactory control.  3. Glaucoma: Not problematic. Continued on pre-admission eye drops.  5. Systolic murmur: This was an incidental finding on physical exam and was evaluated with 2-D echo. See findings as described above.  6. Acute Sinusitis: Fluid-filled left mastoid sinus, was revealed on brain imaging studies. Patient has been treated with a 5-day course of Azithromycin, to be completed on 11/30/11.  7. Dysthyroidism: TSH was low at 0.209, free T4 was normal at 1.16, as was total T3 at 121.2, consistent with subclinical hyperthyroidism. As patient has a bradycardia of 63-66/min at this time, beta-blockade was not instituted. She will need outpatient follow up of her thyroid tests, by PMD, at his own discretion. .    Procedures:  See below.  Consultations:  N/A.   Discharge Exam: Filed Vitals:   11/26/11 9562 11/26/11 1314 11/26/11 2108 11/26/11 2121  BP: 148/46 152/54  120/62  Pulse: 66 63 63   Temp: 98.2 F (36.8 C) 97.7 F (36.5 C) 98.5 F (36.9 C)   TempSrc: Oral  Oral   Resp: 16 18 16  Height:      Weight:      SpO2: 100% 97% 100%     General: Comfortable, alert, communicative, fully oriented, not short of breath at  rest.  HEENT: No clinical pallor, no jaundice, no conjunctival injection or discharge. Hydration is satisfactory.  NECK: Supple, JVP not seen, no carotid bruits, no palpable lymphadenopathy, no palpable goiter.  CHEST: Clinically clear to auscultation, no wheezes, no crackles.  HEART: Sounds 1 and 2 heard, normal, regular, systolic murmurs.  ABDOMEN: Full, soft, non-tender, no palpable organomegaly, no palpable masses, normal bowel sounds.  GENITALIA: Not examined.  LOWER EXTREMITIES: No pitting edema, palpable peripheral pulses.  MUSCULOSKELETAL SYSTEM: Generalized osteoarthritic changes, otherwise, normal.  CENTRAL NERVOUS SYSTEM: No focal neurologic deficit on gross examination.  Discharge Instructions      Discharge Orders    Future Orders Please Complete By Expires   Diet - low sodium heart healthy      Increase activity slowly          Medication List     As of 11/27/2011 11:08 AM    TAKE these medications         amLODipine 10 MG tablet   Commonly known as: NORVASC   Take 1 tablet (10 mg total) by mouth daily.      aspirin 81 MG EC tablet   Take 1 tablet (81 mg total) by mouth daily.      azithromycin 250 MG tablet   Commonly known as: ZITHROMAX   Take 1 tablet (250 mg total) by mouth daily. MAY ADMINISTER WITHOUT REGARD TO MEALS      brimonidine 0.1 % Soln   Commonly known as: ALPHAGAN P   Place 1 drop into both eyes 2 (two) times daily.      carboxymethylcellulose 0.5 % Soln   Commonly known as: REFRESH PLUS   Place 1 drop into both eyes 3 (three) times daily as needed.      dorzolamide-timolol 22.3-6.8 MG/ML ophthalmic solution   Commonly known as: COSOPT   Place 1 drop into both eyes 2 (two) times daily.      lisinopril 10 MG tablet   Commonly known as: PRINIVIL,ZESTRIL   Take 1 tablet (10 mg total) by mouth daily.      meclizine 50 MG tablet   Commonly known as: ANTIVERT   Take 0.5 tablets (25 mg total) by mouth 3 (three) times daily as needed.        Travoprost (BAK Free) 0.004 % Soln ophthalmic solution   Commonly known as: TRAVATAN   Place 1 drop into both eyes at bedtime.         Follow-up Information    Follow up with BURNETT,BRENT A, MD. In 1 week.   Contact information:   [address] [place] [address] [phone]           The results of significant diagnostics from this hospitalization (including imaging, microbiology, ancillary and laboratory) are listed below for reference.    Significant Diagnostic Studies: Ct Head Wo Contrast  11/25/2011  *RADIOLOGY REPORT*  Clinical Data: Dizziness and weakness.  CT HEAD WITHOUT CONTRAST  Technique:  Contiguous axial images were obtained from the base of the skull through the vertex without contrast.  Comparison: None.  Findings: Bone windows demonstrate minimal mucosal thickening of the maxillary sinuses.  Hypoplastic frontal sinuses.  Partial opacification of the inferior aspect of the left mastoid air cells; image 4 of series 3.  Soft tissue windows demonstrate normal cerebral  volume for age. Mild low density in the periventricular white matter likely related to small vessel disease. No  mass lesion, hemorrhage, hydrocephalus, acute infarct, intra-axial, or extra-axial fluid collection.  IMPRESSION:  1. No acute intracranial abnormality. 2.  Relatively mild for age small vessel ischemic change. 3.  Fluid in the left mastoid air cells.   Original Report Authenticated By: Consuello Bossier, M.D.    Mr Brain Wo Contrast  11/25/2011  *RADIOLOGY REPORT*  Clinical Data: 78 year old female with vertigo.  Dizziness and weakness.  MRI HEAD WITHOUT CONTRAST  Technique:  Multiplanar, multiecho pulse sequences of the brain and surrounding structures were obtained according to standard protocol without intravenous contrast.  Comparison: Head CT without contrast 11/25/2011.  Findings: No restricted diffusion to suggest acute infarction.  No ventriculomegaly. No midline shift, mass effect, or  evidence of mass lesion. No acute intracranial hemorrhage identified. Negative pituitary and cervicomedullary junction. 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  Widespread patchy and confluent cerebral white matter T2 and FLAIR hyperintensity. No areas of cortical encephalomalacia identified.  Deep gray matter nuclei and brain stem within normal limits.  There may be a tiny chronic lacunar infarct in the left cerebellar hemisphere (series 5 image 5).  Negative visualized cervical spine.  Normal marrow signal. Postoperative changes to the right globe.  Minimal paranasal sinus mucosal thickening. Negative scalp soft tissues. Left mastoid air cell fluid, more so inferiorly.  Negative visualized nasopharynx. Right mastoids are clear.  Asymmetric right petrous apex pneumatization incidentally noted. Other visualized internal auditory structures appear grossly normal.  IMPRESSION: 1. No acute intracranial abnormality. 2.  Moderate nonspecific white matter changes, favor related to chronic small vessel disease. 3.  Left mastoid effusion, significance doubtful.   Original Report Authenticated By: Harley Hallmark, M.D.     Microbiology: No results found for this or any previous visit (from the past 240 hour(s)).   Labs: Basic Metabolic Panel:  Lab 11/27/11 1610 11/26/11 0352 11/25/11 1857 11/25/11 0950  NA 142 143 -- 136  K 3.4* 3.1* -- 3.8  CL 109 109 -- 102  CO2 27 26 -- 24  GLUCOSE 99 99 -- 138*  BUN 14 12 -- 15  CREATININE 0.80 0.70 -- 0.63  CALCIUM 8.7 9.0 -- 9.3  MG -- -- 2.1 --  PHOS -- -- 2.5 --   Liver Function Tests:  Lab 11/25/11 1857 11/25/11 0950  AST 24 18  ALT 12 11  ALKPHOS 64 64  BILITOT 0.4 0.4  PROT 7.4 7.3  ALBUMIN 4.1 3.9   No results found for this basename: LIPASE:5,AMYLASE:5 in the last 168 hours No results found for this basename: AMMONIA:5 in the last 168 hours CBC:  Lab 11/27/11 0422 11/26/11  0352 11/25/11 0950  WBC 5.2 6.1 5.0  NEUTROABS -- -- 3.9  HGB 13.3 12.9 14.5  HCT 40.3 39.5 43.7  MCV 82.6 81.8 81.4  PLT 166 157 167   Cardiac Enzymes: No results found for this basename: CKTOTAL:5,CKMB:5,CKMBINDEX:5,TROPONINI:5 in the last 168 hours BNP: BNP (last 3 results) No results found for this basename: PROBNP:3 in the last 8760 hours CBG:  Lab 11/25/11 0917  GLUCAP 110*    Time coordinating discharge: 40 minutes  Signed:  Marlyne Totaro,CHRISTOPHER  [facility] 11/27/2011, 11:08 AM

## 2011-11-27 LAB — CBC
MCV: 82.6 fL (ref 78.0–100.0)
Platelets: 166 10*3/uL (ref 150–400)
RBC: 4.88 MIL/uL (ref 3.87–5.11)
WBC: 5.2 10*3/uL (ref 4.0–10.5)

## 2011-11-27 LAB — BASIC METABOLIC PANEL
CO2: 27 mEq/L (ref 19–32)
Chloride: 109 mEq/L (ref 96–112)
Sodium: 142 mEq/L (ref 135–145)

## 2011-11-27 LAB — T3: T3, Total: 121.2 ng/dl (ref 80.0–204.0)

## 2011-11-27 LAB — T4, FREE: Free T4: 1.16 ng/dL (ref 0.80–1.80)

## 2011-11-27 MED ORDER — POTASSIUM CHLORIDE CRYS ER 20 MEQ PO TBCR
40.0000 meq | EXTENDED_RELEASE_TABLET | Freq: Once | ORAL | Status: AC
  Administered 2011-11-27: 40 meq via ORAL
  Filled 2011-11-27: qty 2

## 2011-11-27 MED ORDER — AMLODIPINE BESYLATE 10 MG PO TABS: 10.0000 mg | ORAL_TABLET | Freq: Every day | ORAL | Status: AC

## 2011-11-27 MED ORDER — ASPIRIN 81 MG PO TBEC: 81.0000 mg | DELAYED_RELEASE_TABLET | Freq: Every day | ORAL | Status: AC

## 2011-11-27 MED ORDER — AZITHROMYCIN 250 MG PO TABS: ORAL_TABLET | ORAL | Status: AC

## 2011-11-27 NOTE — Progress Notes (Signed)
*  PRELIMINARY RESULTS* Vascular Ultrasound Carotid Duplex (Doppler) has been completed.   There is no evidence of hemodynamically significant internal carotid artery stenosis bilateral. Vertebral arteries are patent with antegrade flow.  11/27/2011 10:51 AM Gertie Fey, RDMS, RDCS

## 2011-11-27 NOTE — Evaluation (Signed)
Occupational Therapy Evaluation Patient Details Name: Carol Munoz MRN: [id number] DOB: 07/06/1924 Today's Date: 11/27/2011 Time: 4098-1191 OT Time Calculation (min): 19 min  OT Assessment / Plan / Recommendation Clinical Impression  This 78 year old female was admitted for vertigo.  Pt has been mostly asymptomatic but reported one episode after midnight when sitting on commode.  Checked for BPPV and pt had mild response to L side.  Did not have more with second trial but gave HEP in case dizziness persists.  Pt will follow up with OP Neuro at their balance program.      OT Assessment  Patient needs continued OT Services    Follow Up Recommendations  Supervision/Assistance - 24 hour (OP PT balance program)    Barriers to Discharge      Equipment Recommendations  None recommended by OT;None recommended by PT    Recommendations for Other Services    Frequency  Min 2X/week    Precautions / Restrictions Precautions Precautions: Fall Restrictions Weight Bearing Restrictions: No   Pertinent Vitals/Pain No pain.  Mild dizziness during BPPV test    ADL  Transfers/Ambulation Related to ADLs: Pt's sister will help with ADLs this am.  Pt has been walking to bathroom with IV pole ADL Comments: Pt able to bend forward and put sock on.  Able to look up to ceiling.  Tested BPPV with modified Gilberto Better.  Pt had mild reaction to L side but had eyes closed most of time.  Lasted only about 10 seconds.  No reaction to R side and when retested L, no reaction. Pt plans OP PT at Neurorehab for balance. Gave Brandt-Daroff exercises if dizziness persists.  Reviewed with pt and sister, and handout given. Worked through exercise and pt verbalizes understanding    OT Diagnosis: Other (comment) (BPPV)  OT Problem List: Impaired balance (sitting and/or standing);Other (comment) (BPPV) OT Treatment Interventions: Self-care/ADL training;DME and/or AE instruction;Patient/family education;Balance  training;Therapeutic activities   OT Goals Acute Rehab OT Goals OT Goal Formulation: With patient Time For Goal Achievement: 12/04/11 Potential to Achieve Goals: Good Miscellaneous OT Goals Miscellaneous OT Goal #1: Pt will be independent with Brandt-Daroff exercises for L BPPV OT Goal: Miscellaneous Goal #1 - Progress: Goal set today Miscellaneous OT Goal #2: Pt will be mod I with basic adls  and bathroom transfers OT Goal: Miscellaneous Goal #2 - Progress: Goal set today  Visit Information  Last OT Received On: 11/27/11 Assistance Needed: +1    Subjective Data  Subjective: I got a little dizzy after midnight when I was up on the commode   Prior Functioning  Vision/Perception  Home Living Lives With: Alone Available Help at Discharge: Family;Available 24 hours/day (daughter and granddaughter) Bathroom Shower/Tub: Engineer, manufacturing systems: Handicapped height Home Adaptive Equipment: Straight cane;Grab bars in shower Prior Function Level of Independence: Independent Communication Communication: No difficulties      Cognition  Overall Cognitive Status: Appears within functional limits for tasks assessed/performed Arousal/Alertness: Awake/alert Orientation Level: Appears intact for tasks assessed Behavior During Session: [facility] for tasks performed    Extremity/Trunk Assessment Right Upper Extremity Assessment RUE ROM/Strength/Tone: [facility] for tasks assessed Left Upper Extremity Assessment LUE ROM/Strength/Tone: [facility] for tasks assessed   Mobility  Shoulder Instructions          Exercise     Balance     End of Session OT - End of Session Activity Tolerance: Patient tolerated treatment well Patient left: in bed;with call bell/phone within reach;with family/visitor present  GO Functional Assessment  Tool Used: clinical observation Functional Limitation: Self care Self Care Current Status [phone]): At least 1 percent but less than 20 percent impaired, limited or  restricted Self Care Goal Status ([id number]): 0 percent impaired, limited or restricted   Whitley Strycharz 11/27/2011, 10:49 AM Marica Otter, OTR/L [phone] 11/27/2011

## 2011-11-27 NOTE — Progress Notes (Signed)
Discharge instructions explained to patient and her sister. Prescriptions given to her sister

## 2017-04-15 ENCOUNTER — Emergency Department (HOSPITAL_COMMUNITY): Payer: Medicare Other

## 2017-04-15 ENCOUNTER — Inpatient Hospital Stay (HOSPITAL_COMMUNITY)
Admission: EM | Admit: 2017-04-15 | Discharge: 2017-04-20 | DRG: 194 | Disposition: A | Discharge status: Home Health | Payer: Medicare Other | Attending: Internal Medicine | Admitting: Internal Medicine

## 2017-04-15 ENCOUNTER — Encounter (HOSPITAL_COMMUNITY): Payer: Self-pay | Admitting: Emergency Medicine

## 2017-04-15 DIAGNOSIS — R05 Cough: Secondary | ICD-10-CM | POA: Diagnosis not present

## 2017-04-15 DIAGNOSIS — J181 Lobar pneumonia, unspecified organism: Principal | ICD-10-CM | POA: Diagnosis present

## 2017-04-15 DIAGNOSIS — E079 Disorder of thyroid, unspecified: Secondary | ICD-10-CM | POA: Diagnosis not present

## 2017-04-15 DIAGNOSIS — R7881 Bacteremia: Secondary | ICD-10-CM | POA: Diagnosis present

## 2017-04-15 DIAGNOSIS — E876 Hypokalemia: Secondary | ICD-10-CM | POA: Diagnosis present

## 2017-04-15 DIAGNOSIS — H409 Unspecified glaucoma: Secondary | ICD-10-CM | POA: Diagnosis present

## 2017-04-15 DIAGNOSIS — J189 Pneumonia, unspecified organism: Secondary | ICD-10-CM | POA: Diagnosis present

## 2017-04-15 DIAGNOSIS — E0590 Thyrotoxicosis, unspecified without thyrotoxic crisis or storm: Secondary | ICD-10-CM | POA: Diagnosis present

## 2017-04-15 DIAGNOSIS — Z79899 Other long term (current) drug therapy: Secondary | ICD-10-CM

## 2017-04-15 DIAGNOSIS — I10 Essential (primary) hypertension: Secondary | ICD-10-CM | POA: Diagnosis present

## 2017-04-15 DIAGNOSIS — J9811 Atelectasis: Secondary | ICD-10-CM | POA: Diagnosis present

## 2017-04-15 DIAGNOSIS — R059 Cough, unspecified: Secondary | ICD-10-CM

## 2017-04-15 DIAGNOSIS — B9620 Unspecified Escherichia coli [E. coli] as the cause of diseases classified elsewhere: Secondary | ICD-10-CM | POA: Diagnosis present

## 2017-04-15 LAB — COMPREHENSIVE METABOLIC PANEL
ALBUMIN: 3.4 g/dL — AB (ref 3.5–5.0)
ALK PHOS: 111 U/L (ref 38–126)
ALT: 151 U/L — ABNORMAL HIGH (ref 14–54)
ANION GAP: 10 (ref 5–15)
AST: 118 U/L — ABNORMAL HIGH (ref 15–41)
BILIRUBIN TOTAL: 2.3 mg/dL — AB (ref 0.3–1.2)
BUN: 19 mg/dL (ref 6–20)
CALCIUM: 9 mg/dL (ref 8.9–10.3)
CO2: 24 mmol/L (ref 22–32)
Chloride: 101 mmol/L (ref 101–111)
Creatinine, Ser: 0.73 mg/dL (ref 0.44–1.00)
GLUCOSE: 143 mg/dL — AB (ref 65–99)
POTASSIUM: 3 mmol/L — AB (ref 3.5–5.1)
Sodium: 135 mmol/L (ref 135–145)
TOTAL PROTEIN: 6.9 g/dL (ref 6.5–8.1)

## 2017-04-15 LAB — CBC WITH DIFFERENTIAL/PLATELET
BASOS PCT: 0 %
Basophils Absolute: 0 10*3/uL (ref 0.0–0.1)
Eosinophils Absolute: 0 10*3/uL (ref 0.0–0.7)
Eosinophils Relative: 0 %
HEMATOCRIT: 34.8 % — AB (ref 36.0–46.0)
HEMOGLOBIN: 11.3 g/dL — AB (ref 12.0–15.0)
LYMPHS ABS: 0.4 10*3/uL — AB (ref 0.7–4.0)
Lymphocytes Relative: 5 %
MCH: 26.4 pg (ref 26.0–34.0)
MCHC: 32.5 g/dL (ref 30.0–36.0)
MCV: 81.3 fL (ref 78.0–100.0)
MONO ABS: 0.6 10*3/uL (ref 0.1–1.0)
Monocytes Relative: 7 %
NEUTROS ABS: 6.6 10*3/uL (ref 1.7–7.7)
NEUTROS PCT: 88 %
Platelets: 167 10*3/uL (ref 150–400)
RBC: 4.28 MIL/uL (ref 3.87–5.11)
RDW: 15.1 % (ref 11.5–15.5)
WBC: 7.6 10*3/uL (ref 4.0–10.5)

## 2017-04-15 LAB — URINALYSIS, ROUTINE W REFLEX MICROSCOPIC
Bacteria, UA: NONE SEEN
Bilirubin Urine: NEGATIVE
GLUCOSE, UA: NEGATIVE mg/dL
Hgb urine dipstick: NEGATIVE
KETONES UR: 20 mg/dL — AB
NITRITE: NEGATIVE
PH: 7 (ref 5.0–8.0)
PROTEIN: NEGATIVE mg/dL
Specific Gravity, Urine: 1.014 (ref 1.005–1.030)

## 2017-04-15 LAB — I-STAT CG4 LACTIC ACID, ED: Lactic Acid, Venous: 0.81 mmol/L (ref 0.5–1.9)

## 2017-04-15 LAB — INFLUENZA PANEL BY PCR (TYPE A & B)
INFLAPCR: NEGATIVE
Influenza B By PCR: NEGATIVE

## 2017-04-15 LAB — TROPONIN I: Troponin I: <0.03 ng/mL (ref <0.03)

## 2017-04-15 MED ORDER — HYDROCHLOROTHIAZIDE 25 MG PO TABS
25.0000 mg | ORAL_TABLET | Freq: Every day | ORAL | Status: DC
  Administered 2017-04-16 – 2017-04-20 (×5): 25 mg via ORAL
  Filled 2017-04-15 (×5): qty 1

## 2017-04-15 MED ORDER — IPRATROPIUM-ALBUTEROL 0.5-2.5 (3) MG/3ML IN SOLN
3.0000 mL | Freq: Two times a day (BID) | RESPIRATORY_TRACT | Status: DC
  Administered 2017-04-16 – 2017-04-18 (×5): 3 mL via RESPIRATORY_TRACT
  Filled 2017-04-15 (×5): qty 3

## 2017-04-15 MED ORDER — AMLODIPINE BESYLATE 10 MG PO TABS
10.0000 mg | ORAL_TABLET | Freq: Every day | ORAL | Status: DC
Start: 2017-04-16 — End: 2017-04-20
  Administered 2017-04-16 – 2017-04-20 (×5): 10 mg via ORAL
  Filled 2017-04-15 (×5): qty 1

## 2017-04-15 MED ORDER — DEXTROSE 5 % IV SOLN: 1.0000 g | INTRAVENOUS | Status: DC

## 2017-04-15 MED ORDER — DEXTROSE 5 % IV SOLN
1.0000 g | Freq: Once | INTRAVENOUS | Status: AC
  Administered 2017-04-15: 1 g via INTRAVENOUS
  Filled 2017-04-15: qty 10

## 2017-04-15 MED ORDER — LATANOPROST 0.005 % OP SOLN
1.0000 [drp] | Freq: Every day | OPHTHALMIC | Status: DC
  Administered 2017-04-16 – 2017-04-19 (×5): 1 [drp] via OPHTHALMIC
  Filled 2017-04-15: qty 2.5

## 2017-04-15 MED ORDER — SODIUM CHLORIDE 0.9 % IV SOLN
INTRAVENOUS | Status: DC
  Administered 2017-04-15 – 2017-04-17 (×2): via INTRAVENOUS

## 2017-04-15 MED ORDER — ACETAMINOPHEN 325 MG PO TABS
650.0000 mg | ORAL_TABLET | Freq: Four times a day (QID) | ORAL | Status: DC | PRN
  Administered 2017-04-16: 650 mg via ORAL
  Filled 2017-04-15: qty 2

## 2017-04-15 MED ORDER — DORZOLAMIDE HCL-TIMOLOL MAL 2-0.5 % OP SOLN
1.0000 [drp] | Freq: Two times a day (BID) | OPHTHALMIC | Status: DC
Start: 2017-04-15 — End: 2017-04-20
  Administered 2017-04-16 – 2017-04-20 (×10): 1 [drp] via OPHTHALMIC
  Filled 2017-04-15 (×2): qty 10

## 2017-04-15 MED ORDER — LOSARTAN POTASSIUM 50 MG PO TABS
100.0000 mg | ORAL_TABLET | Freq: Every day | ORAL | Status: DC
  Administered 2017-04-16 – 2017-04-20 (×5): 100 mg via ORAL
  Filled 2017-04-15 (×5): qty 2

## 2017-04-15 MED ORDER — DEXTROSE 5 % IV SOLN
500.0000 mg | Freq: Once | INTRAVENOUS | Status: AC
  Administered 2017-04-15: 500 mg via INTRAVENOUS
  Filled 2017-04-15: qty 500

## 2017-04-15 MED ORDER — ALBUTEROL SULFATE (2.5 MG/3ML) 0.083% IN NEBU: 2.5000 mg | INHALATION_SOLUTION | RESPIRATORY_TRACT | Status: DC | PRN

## 2017-04-15 MED ORDER — ONDANSETRON HCL 4 MG/2ML IJ SOLN: 4.0000 mg | Freq: Four times a day (QID) | INTRAMUSCULAR | Status: DC | PRN

## 2017-04-15 MED ORDER — POLYVINYL ALCOHOL 1.4 % OP SOLN
2.0000 [drp] | Freq: Three times a day (TID) | OPHTHALMIC | Status: DC | PRN
  Administered 2017-04-16: 2 [drp] via OPHTHALMIC
  Filled 2017-04-15: qty 15

## 2017-04-15 MED ORDER — ACETAMINOPHEN 325 MG PO TABS
650.0000 mg | ORAL_TABLET | Freq: Once | ORAL | Status: AC
  Administered 2017-04-15: 650 mg via ORAL
  Filled 2017-04-15: qty 2

## 2017-04-15 MED ORDER — ACETAMINOPHEN 650 MG RE SUPP: 650.0000 mg | Freq: Four times a day (QID) | RECTAL | Status: DC | PRN

## 2017-04-15 MED ORDER — POTASSIUM CHLORIDE CRYS ER 20 MEQ PO TBCR
40.0000 meq | EXTENDED_RELEASE_TABLET | Freq: Once | ORAL | Status: AC
  Administered 2017-04-15: 40 meq via ORAL
  Filled 2017-04-15: qty 2

## 2017-04-15 MED ORDER — ENOXAPARIN SODIUM 40 MG/0.4ML ~~LOC~~ SOLN
40.0000 mg | Freq: Every day | SUBCUTANEOUS | Status: DC
  Administered 2017-04-16 – 2017-04-19 (×4): 40 mg via SUBCUTANEOUS
  Filled 2017-04-15 (×4): qty 0.4

## 2017-04-15 MED ORDER — ONDANSETRON HCL 4 MG PO TABS: 4.0000 mg | ORAL_TABLET | Freq: Four times a day (QID) | ORAL | Status: DC | PRN

## 2017-04-15 MED ORDER — DEXTROSE 5 % IV SOLN
500.0000 mg | INTRAVENOUS | Status: DC
  Administered 2017-04-16 – 2017-04-19 (×4): 500 mg via INTRAVENOUS
  Filled 2017-04-15 (×5): qty 500

## 2017-04-15 MED ORDER — BRIMONIDINE TARTRATE 0.15 % OP SOLN
1.0000 [drp] | Freq: Three times a day (TID) | OPHTHALMIC | Status: DC
  Administered 2017-04-16 – 2017-04-20 (×15): 1 [drp] via OPHTHALMIC
  Filled 2017-04-15: qty 5

## 2017-04-15 MED ORDER — LOSARTAN POTASSIUM-HCTZ 100-25 MG PO TABS: 1.0000 | ORAL_TABLET | Freq: Every day | ORAL | Status: DC

## 2017-04-15 MED ORDER — ALBUTEROL SULFATE (2.5 MG/3ML) 0.083% IN NEBU
2.5000 mg | INHALATION_SOLUTION | Freq: Once | RESPIRATORY_TRACT | Status: AC
  Administered 2017-04-15: 2.5 mg via RESPIRATORY_TRACT
  Filled 2017-04-15: qty 3

## 2017-04-15 MED ORDER — ONDANSETRON HCL 4 MG/2ML IJ SOLN
4.0000 mg | Freq: Once | INTRAMUSCULAR | Status: AC
  Administered 2017-04-15: 4 mg via INTRAVENOUS
  Filled 2017-04-15: qty 2

## 2017-04-15 MED ORDER — SODIUM CHLORIDE 0.9 % IV SOLN
INTRAVENOUS | Status: DC
  Administered 2017-04-16: 01:00:00 via INTRAVENOUS

## 2017-04-15 MED ORDER — METHIMAZOLE 5 MG PO TABS
2.5000 mg | ORAL_TABLET | Freq: Two times a day (BID) | ORAL | Status: DC
  Administered 2017-04-16 – 2017-04-19 (×7): 2.5 mg via ORAL
  Filled 2017-04-15 (×11): qty 1

## 2017-04-15 NOTE — H&P (Signed)
[facility] History and Physical  Carol Munoz WJX:[id number] DOB: 1925-01-10 DOA: 04/15/2017  Referring physician:  PCP: Patient, No Pcp Per   Chief Complaint: "I feel weak."  HPI: Carol Munoz is a 84 y.o. female past medical history negative for hyperthyroid, glaucoma and arthritis presents to the emergency room room with weakness.  She had sudden onset weakness for 1 day.  Denies having any other symptoms.  No cough, congestion, fever, chills, nausea, vomiting, diarrhea.  Denies sick contacts.  No recent upper respiratory tract infection symptoms.  Patient does state that she has been drinking less over the last 2 days.  Patient states he normally drinks very little water baseline due to frequent urination.  Due to her arthritis it is hard for her to get around.  So she tries to limit walking.  ED course: Patient started on antibiotics for pneumonia seen on chest x-ray.  Hospitalist consulted for admission.   Review of Systems:  As per HPI otherwise 10 point review of systems negative.    Past Medical History:  Diagnosis Date  . Arthritis    knees  . Glaucoma    Past Surgical History:  Procedure Laterality Date  . CATARACT EXTRACTION     Social History:  reports that  has never smoked. she has never used smokeless tobacco. She reports that she does not drink alcohol or use drugs.  No Known Allergies  No family history on file.   Prior to Admission medications   Medication Sig Start Date End Date Taking? Authorizing Provider  amLODipine (NORVASC) 10 MG tablet Take 1 tablet (10 mg total) by mouth daily. 11/27/11 04/15/17 Yes Oti, Osie Cheeks, MD  brimonidine (ALPHAGAN P) 0.1 % SOLN Place 1 drop into both eyes 2 (two) times daily.   Yes [provider]  carboxymethylcellulose (REFRESH PLUS) 0.5 % SOLN Place 1 drop into both eyes 3 (three) times daily as needed.   Yes [provider]  dorzolamide-timolol (COSOPT) 22.3-6.8 MG/ML ophthalmic solution Place 1 drop  into both eyes 2 (two) times daily.   Yes [provider]  Latanoprost 0.005 % EMUL Place 1 drop into both eyes at bedtime. 08/12/16  Yes [provider]  losartan-hydrochlorothiazide (HYZAAR) 100-25 MG tablet Take 1 tablet by mouth daily. 11/20/16  Yes [provider]  methimazole (TAPAZOLE) 5 MG tablet Take 2.5 mg by mouth 2 (two) times daily. 11/20/16  Yes [provider]  lisinopril (PRINIVIL) 10 MG tablet Take 1 tablet (10 mg total) by mouth daily. 11/25/11 11/24/12  Loren Racer, MD   Physical Exam: Vitals:   04/15/17 1800 04/15/17 1830 04/15/17 1930 04/15/17 2200  BP: (!) 145/63 (!) 160/78 (!) 146/60 140/74  Pulse: 84 79 (!) 102 94  Resp: (!) 21 (!) 21 (!) 30 (!) 22  Temp:    98 F (36.7 C)  TempSrc:    Oral  SpO2: 100% 100% 96% 96%    Wt Readings from Last 3 Encounters:  11/25/11 60.3 kg (132 lb 14.4 oz)    General:  Appears calm and comfortable; A&Ox3 Eyes:  PERRL, EOMI, normal lids, iris ENT:  grossly normal hearing, lips & tongue Neck:  no LAD, masses or thyromegaly Cardiovascular:  RRR, no m/r/g. No LE edema.  Respiratory:  CTA bilaterally, no w/r/r. Tachypnea Abdomen:  soft, ntnd Skin:  no rash or induration seen on limited exam Musculoskeletal:  grossly normal tone BUE/BLE Psychiatric:  grossly normal mood and affect, speech fluent and appropriate Neurologic:  CN 2-12 grossly  intact, moves all extremities in coordinated fashion.          Labs on Admission:  Basic Metabolic Panel: Recent Labs  Lab 04/15/17 1705  NA 135  K 3.0*  CL 101  CO2 24  GLUCOSE 143*  BUN 19  CREATININE 0.73  CALCIUM 9.0   Liver Function Tests: Recent Labs  Lab 04/15/17 1705  AST 118*  ALT 151*  ALKPHOS 111  BILITOT 2.3*  PROT 6.9  ALBUMIN 3.4*   No results for input(s): LIPASE, AMYLASE in the last 168 hours. No results for input(s): AMMONIA in the last 168 hours. CBC: Recent Labs  Lab 04/15/17 1705  WBC 7.6  NEUTROABS 6.6  HGB  11.3*  HCT 34.8*  MCV 81.3  PLT 167   Cardiac Enzymes: Recent Labs  Lab 04/15/17 1705  TROPONINI <0.03    BNP (last 3 results) No results for input(s): BNP in the last 8760 hours.  ProBNP (last 3 results) No results for input(s): PROBNP in the last 8760 hours.   Creatinine clearance cannot be calculated (Unknown ideal weight.)  CBG: No results for input(s): GLUCAP in the last 168 hours.  Radiological Exams on Admission: Dg Chest 2 View  Result Date: 04/15/2017 CLINICAL DATA:  Dry cough, intermittent weakness. EXAM: CHEST  2 VIEW COMPARISON:  None. FINDINGS: Cardiac silhouette is mildly enlarged. Tortuous calcified aorta. Patchy RIGHT middle lobe airspace opacity with bibasilar strandy densities. No pleural effusion. No pneumothorax. Trachea displaced to the RIGHT, mildly narrowed. Soft tissue planes and included osseous structures are nonacute. Mild degenerative change of the spine. IMPRESSION: RIGHT middle lobe pneumonia, possible chronic infection. Bibasilar atelectasis/scarring. Followup PA and lateral chest X-ray is recommended in 3-4 weeks following trial of antibiotic therapy to ensure resolution and exclude underlying malignancy. Tracheal displacement and narrowing concerning for mediastinal mass, possible substernal thyroid. Consider CT chest with contrast on a nonemergent basis. Aortic Atherosclerosis (ICD10-I70.0). Electronically Signed   By: Awilda Metroourtnay  Bloomer M.D.   On: 04/15/2017 18:04    EKG: Independently reviewed. RBBB  Assessment/Plan Active Problems:   CAP (community acquired pneumonia)  CAP Sch duonebs When necessary albuterol Antibiotic- azithro and rocephin Oxygen therapy Continuous pulse oximetry Flutter IS Sputum & Gram stain Ur Strep antigen  Hypertension When necessary hydralazine 10 mg IV as needed for severe blood pressure Cont norvasc, hyzaar  Glaucoma Cont Alphagan, cosopt, latanoprost  Thyoid dz Cont tapazole   Code Status: FC    DVT Prophylaxis: lovenox Family Communication: g dgtr at bedside Disposition Plan: Pending Improvement  Status: medsurg obs  Haydee SalterPhillip M Mysha Peeler, MD Family Medicine [facility] [email or web address] Password [email or web address]

## 2017-04-15 NOTE — ED Notes (Signed)
Respiratory made aware of breathing treatment. 

## 2017-04-15 NOTE — ED Triage Notes (Signed)
Per EMS, patient coming from home with family, c/o intermittent generalized weakness. Denies N/V/D, abdominal pain, chest pain, and SOB. A&Ox4.  18g L hand

## 2017-04-15 NOTE — ED Notes (Signed)
Attempted to call report x1. Nurse is not available at this time. Will call back in ten minutes.

## 2017-04-15 NOTE — ED Provider Notes (Signed)
[facility]-EMERGENCY DEPT Provider Note   CSN: [id number] Arrival date & time: 04/15/17  1633     History   Chief Complaint Chief Complaint  Patient presents with  . Weakness    HPI Carol Munoz is a 84 y.o. female.  84 year old female presents with 1 day of weakness.  Patient states that she feels as if she has no energy.  Has had mild myalgias but denies any cough or congestion.  No vomiting or diarrhea.  No recent medications.  Denies any urinary symptoms.  No focality to her symptoms.  No rashes headache or photophobia.  Symptoms persistent nothing makes them better or worse.  No treatment used prior to arrival.      Past Medical History:  Diagnosis Date  . Arthritis    knees  . Glaucoma     Patient Active Problem List   Diagnosis Date Noted  . Sinusitis 11/26/2011  . Hyperthyroidism 11/26/2011  . Vertigo 11/25/2011  . HTN (hypertension), malignant 11/25/2011  . Nausea 11/25/2011  . Glaucoma 11/25/2011  . Systolic murmur 11/25/2011    Past Surgical History:  Procedure Laterality Date  . CATARACT EXTRACTION      OB History    No data available       Home Medications    Prior to Admission medications   Medication Sig Start Date End Date Taking? Authorizing Provider  amLODipine (NORVASC) 10 MG tablet Take 1 tablet (10 mg total) by mouth daily. 11/27/11 11/26/12  Laveda Normanti, Chris N, MD  brimonidine (ALPHAGAN P) 0.1 % SOLN Place 1 drop into both eyes 2 (two) times daily.    [provider]  carboxymethylcellulose (REFRESH PLUS) 0.5 % SOLN Place 1 drop into both eyes 3 (three) times daily as needed.    [provider]  dorzolamide-timolol (COSOPT) 22.3-6.8 MG/ML ophthalmic solution Place 1 drop into both eyes 2 (two) times daily.    [provider]  lisinopril (PRINIVIL) 10 MG tablet Take 1 tablet (10 mg total) by mouth daily. 11/25/11 11/24/12  Loren RacerYelverton, David, MD  Travoprost, BAK Free, (TRAVATAN) 0.004 % SOLN  ophthalmic solution Place 1 drop into both eyes at bedtime.    [provider]    Family History No family history on file.  Social History Social History   Tobacco Use  . Smoking status: Never Smoker  . Smokeless tobacco: Never Used  Substance Use Topics  . Alcohol use: No  . Drug use: No     Allergies   Patient has no known allergies.   Review of Systems Review of Systems  All other systems reviewed and are negative.    Physical Exam Updated Vital Signs BP (!) 140/113 (BP Location: Left Arm)   Pulse 78   Temp 99.3 F (37.4 C) (Oral)   Resp 18   SpO2 99%   Physical Exam  Constitutional: She is oriented to person, place, and time. She appears well-developed and well-nourished.  Non-toxic appearance. No distress.  HENT:  Head: Normocephalic and atraumatic.  Eyes: Conjunctivae, EOM and lids are normal. Pupils are equal, round, and reactive to light.  Neck: Normal range of motion. Neck supple. No tracheal deviation present. No thyroid mass present.  Cardiovascular: Normal rate, regular rhythm and normal heart sounds. Exam reveals no gallop.  No murmur heard. Pulmonary/Chest: Effort normal and breath sounds normal. No stridor. No respiratory distress. She has no decreased breath sounds. She has no wheezes. She has no rhonchi. She has no rales.  Abdominal: Soft.  Normal appearance and bowel sounds are normal. She exhibits no distension. There is no tenderness. There is no rebound and no CVA tenderness.  Musculoskeletal: Normal range of motion. She exhibits no edema or tenderness.  Neurological: She is alert and oriented to person, place, and time. She has normal strength. No cranial nerve deficit or sensory deficit. GCS eye subscore is 4. GCS verbal subscore is 5. GCS motor subscore is 6.  Skin: Skin is warm and dry. No abrasion and no rash noted.  Psychiatric: She has a normal mood and affect. Her speech is normal and behavior is normal.  Nursing note and vitals  reviewed.    ED Treatments / Results  Labs (all labs ordered are listed, but only abnormal results are displayed) Labs Reviewed  URINE CULTURE  CULTURE, BLOOD (ROUTINE X 2)  CULTURE, BLOOD (ROUTINE X 2)  CBC WITH DIFFERENTIAL/PLATELET  COMPREHENSIVE METABOLIC PANEL  URINALYSIS, ROUTINE W REFLEX MICROSCOPIC  TROPONIN I  INFLUENZA PANEL BY PCR (TYPE A & B)  I-STAT CG4 LACTIC ACID, ED    EKG  EKG Interpretation  Date/Time:  Thursday April 15 2017 17:08:21 EST Ventricular Rate:  81 PR Interval:    QRS Duration: 128 QT Interval:  382 QTC Calculation: 444 R Axis:   8 Text Interpretation:  Sinus rhythm Atrial premature complex Right bundle branch block Minimal ST elevation, inferior leads Confirmed by Lorre Nick ([id number]) on 04/15/2017 6:27:26 PM       Radiology No results found.  Procedures Procedures (including critical care time)  Medications Ordered in ED Medications  0.9 %  sodium chloride infusion (not administered)     Initial Impression / Assessment and Plan / ED Course  I have reviewed the triage vital signs and the nursing notes.  Pertinent labs & imaging results that were available during my care of the patient were reviewed by me and considered in my medical decision making (see chart for details).     Patient with evidence of pneumonia on chest x-ray on the right side.  Blood cultures and lactate ordered.  Started on IV antibiotics.  Will admit to the hospitalist service  Final Clinical Impressions(s) / ED Diagnoses   Final diagnoses:  Cough    ED Discharge Orders    None       Lorre Nick, MD 04/15/17 [name]

## 2017-04-15 NOTE — Progress Notes (Signed)
Pt gives daughter permission to stay in room while nursing admission hx is done.  Briscoe Burns. Carleane Curvin Hunger BSN, RN-BC Admissions RN 04/15/2017 8:01 PM

## 2017-04-15 NOTE — ED Notes (Signed)
Family at bedside. 

## 2017-04-15 NOTE — ED Notes (Signed)
Bed: [facility] Expected date:  Expected time:  Means of arrival:  Comments: EMS-decreased mobility 

## 2017-04-15 NOTE — ED Notes (Signed)
Patient transported to X-ray 

## 2017-04-15 NOTE — Progress Notes (Signed)
PHARMACY NOTE -  Ceftriaxone/Azithromycin  Pharmacy has been consulted to dose Ceftriaxone and Azithromycin for CAP. Will order Ceftriaxone 1g IV q24h and Azithromycin 500 mg IV q24h x 7 days.  No renal adjustment needed; therefore, pharmacy will sign off since need for further dosage adjustment appears unlikely at present.    Please reconsult if a change in clinical status warrants re-evaluation of dosage.  Thank you for the consult.  Loralee PacasErin Morgin Halls, PharmD, BCPS Pager: [phone] 04/15/2017 6:21 PM

## 2017-04-16 DIAGNOSIS — J189 Pneumonia, unspecified organism: Secondary | ICD-10-CM | POA: Diagnosis not present

## 2017-04-16 DIAGNOSIS — I10 Essential (primary) hypertension: Secondary | ICD-10-CM | POA: Diagnosis present

## 2017-04-16 DIAGNOSIS — R05 Cough: Secondary | ICD-10-CM | POA: Diagnosis present

## 2017-04-16 DIAGNOSIS — J9811 Atelectasis: Secondary | ICD-10-CM | POA: Diagnosis present

## 2017-04-16 DIAGNOSIS — H409 Unspecified glaucoma: Secondary | ICD-10-CM | POA: Diagnosis present

## 2017-04-16 DIAGNOSIS — B9620 Unspecified Escherichia coli [E. coli] as the cause of diseases classified elsewhere: Secondary | ICD-10-CM | POA: Diagnosis present

## 2017-04-16 DIAGNOSIS — E0590 Thyrotoxicosis, unspecified without thyrotoxic crisis or storm: Secondary | ICD-10-CM | POA: Diagnosis present

## 2017-04-16 DIAGNOSIS — E876 Hypokalemia: Secondary | ICD-10-CM | POA: Diagnosis present

## 2017-04-16 DIAGNOSIS — Z79899 Other long term (current) drug therapy: Secondary | ICD-10-CM | POA: Diagnosis not present

## 2017-04-16 DIAGNOSIS — J181 Lobar pneumonia, unspecified organism: Secondary | ICD-10-CM | POA: Diagnosis present

## 2017-04-16 DIAGNOSIS — R7881 Bacteremia: Secondary | ICD-10-CM | POA: Diagnosis present

## 2017-04-16 LAB — BLOOD CULTURE ID PANEL (REFLEXED)
ACINETOBACTER BAUMANNII: NOT DETECTED
CANDIDA ALBICANS: NOT DETECTED
CANDIDA GLABRATA: NOT DETECTED
CANDIDA PARAPSILOSIS: NOT DETECTED
CANDIDA TROPICALIS: NOT DETECTED
Candida krusei: NOT DETECTED
Carbapenem resistance: NOT DETECTED
ENTEROBACTER CLOACAE COMPLEX: NOT DETECTED
Enterobacteriaceae species: DETECTED — AB
Enterococcus species: NOT DETECTED
Escherichia coli: DETECTED — AB
HAEMOPHILUS INFLUENZAE: NOT DETECTED
KLEBSIELLA PNEUMONIAE: NOT DETECTED
Klebsiella oxytoca: NOT DETECTED
Listeria monocytogenes: NOT DETECTED
Neisseria meningitidis: NOT DETECTED
PROTEUS SPECIES: NOT DETECTED
Pseudomonas aeruginosa: NOT DETECTED
STREPTOCOCCUS SPECIES: NOT DETECTED
Serratia marcescens: NOT DETECTED
Staphylococcus aureus (BCID): NOT DETECTED
Staphylococcus species: NOT DETECTED
Streptococcus agalactiae: NOT DETECTED
Streptococcus pneumoniae: NOT DETECTED
Streptococcus pyogenes: NOT DETECTED

## 2017-04-16 LAB — CBC
HEMATOCRIT: 31.2 % — AB (ref 36.0–46.0)
Hemoglobin: 10.2 g/dL — ABNORMAL LOW (ref 12.0–15.0)
MCH: 26.2 pg (ref 26.0–34.0)
MCHC: 32.7 g/dL (ref 30.0–36.0)
MCV: 80.2 fL (ref 78.0–100.0)
Platelets: 158 10*3/uL (ref 150–400)
RBC: 3.89 MIL/uL (ref 3.87–5.11)
RDW: 15.3 % (ref 11.5–15.5)
WBC: 12.6 10*3/uL — AB (ref 4.0–10.5)

## 2017-04-16 LAB — BASIC METABOLIC PANEL
Anion gap: 8 (ref 5–15)
BUN: 14 mg/dL (ref 6–20)
CHLORIDE: 102 mmol/L (ref 101–111)
CO2: 25 mmol/L (ref 22–32)
Calcium: 8.4 mg/dL — ABNORMAL LOW (ref 8.9–10.3)
Creatinine, Ser: 0.73 mg/dL (ref 0.44–1.00)
GFR calc Af Amer: >60 mL/min (ref >60)
GFR calc non Af Amer: >60 mL/min (ref >60)
GLUCOSE: 169 mg/dL — AB (ref 65–99)
POTASSIUM: 3.1 mmol/L — AB (ref 3.5–5.1)
SODIUM: 135 mmol/L (ref 135–145)

## 2017-04-16 MED ORDER — IPRATROPIUM-ALBUTEROL 0.5-2.5 (3) MG/3ML IN SOLN: 3.0000 mL | RESPIRATORY_TRACT | Status: DC | PRN

## 2017-04-16 MED ORDER — SODIUM CHLORIDE (HYPERTONIC) 5 % OP OINT
TOPICAL_OINTMENT | OPHTHALMIC | Status: DC | PRN
  Filled 2017-04-16: qty 3.5

## 2017-04-16 MED ORDER — DOCUSATE SODIUM 100 MG PO CAPS
100.0000 mg | ORAL_CAPSULE | Freq: Every day | ORAL | Status: DC | PRN
  Administered 2017-04-16: 100 mg via ORAL
  Filled 2017-04-16: qty 1

## 2017-04-16 MED ORDER — TRAZODONE HCL 50 MG PO TABS
50.0000 mg | ORAL_TABLET | Freq: Once | ORAL | Status: AC
  Administered 2017-04-16: 50 mg via ORAL
  Filled 2017-04-16: qty 1

## 2017-04-16 MED ORDER — CEFTRIAXONE SODIUM 2 G IJ SOLR
2.0000 g | INTRAMUSCULAR | Status: DC
  Administered 2017-04-16 – 2017-04-19 (×4): 2 g via INTRAVENOUS
  Filled 2017-04-16 (×6): qty 2

## 2017-04-16 NOTE — Progress Notes (Addendum)
Patient ID: Carol Munoz, female   DOB: [birth year], 84 y.o.   MRN: [id number]  PROGRESS NOTE    Kaizley Aja  NWG:[id number] DOB: 1924-09-27 DOA: 04/15/2017  PCP: Patient, No Pcp Per   Brief Narrative:   84 y.o. female past medical history negative for hyperthyroid, glaucoma and arthritis presents to the emergency room room with weakness.  She had sudden onset weakness for 1 day.  No cough, congestion, fever, chills, nausea, vomiting, diarrhea.   Pt found to have pneumonia on CXR and was started on empiric abx while awaiting culture results.   Assessment & Plan:   Active Problems:   CAP (community acquired pneumonia) / Leukocytosis  - CXR on admission showed right middle lobe pneumonia, possible chronic infection. Bibasilar atelectasis/scarring. Followup PA and lateral chest X-ray is recommended in 3-4 weeks following trial of antibiotic therapy to ensure resolution and exclude underlying malignancy. Tracheal displacement and narrowing concerning for mediastinal mass, possible substernal thyroid. Consider CT chest with contrast on a nonemergent basis - Continue azithromycin and rocephin    E.Coli bacteremia - Continue rocephin - Follow up final blood cx results     Hypokalemia - Supplemented    Essential hypertension - Continue Norvasc and Hctz - Continue losartan     Hyperthyroidism - Continue methimazole     DVT prophylaxis: Lovenox subQ Code Status: full code  Family Communication: niece at bedside Disposition Plan: home once stable    Consultants:   PT  Procedures:   None  Antimicrobials:   Azithromycin and rocephin 1/31 -->   Subjective: Feels tired.  Objective: Vitals:   04/15/17 1930 04/15/17 2200 04/16/17 0631 04/16/17 1016  BP: (!) 146/60 140/74 127/77   Pulse: (!) 102 94 87   Resp: (!) 30 (!) 22 18   Temp:  98 F (36.7 C) 98.4 F (36.9 C)   TempSrc:  Oral Oral   SpO2: 96% 96% 97% 93%    Intake/Output Summary (Last 24 hours) at 04/16/2017  1040 Last data filed at 04/16/2017 5784 Gross per 24 hour  Intake 1475 ml  Output -  Net 1475 ml   There were no vitals filed for this visit.  Examination:  General exam: Appears calm and comfortable  Respiratory system: Diminished breath sounds, no wheezing  Cardiovascular system: S1 & S2 heard, RRR. Gastrointestinal system: Abdomen is nondistended, soft and nontender. No organomegaly or masses felt. Normal bowel sounds heard. Central nervous system: Alert and oriented. No focal neurological deficits. Extremities: Symmetric 5 x 5 power. Skin: No rashes, lesions or ulcers Psychiatry: Judgement and insight appear normal. Mood & affect appropriate.   Data Reviewed: I have personally reviewed following labs and imaging studies  CBC: Recent Labs  Lab 04/15/17 1705 04/16/17 0333  WBC 7.6 12.6*  NEUTROABS 6.6  --   HGB 11.3* 10.2*  HCT 34.8* 31.2*  MCV 81.3 80.2  PLT 167 158   Basic Metabolic Panel: Recent Labs  Lab 04/15/17 1705 04/16/17 0333  NA 135 135  K 3.0* 3.1*  CL 101 102  CO2 24 25  GLUCOSE 143* 169*  BUN 19 14  CREATININE 0.73 0.73  CALCIUM 9.0 8.4*   GFR: CrCl cannot be calculated (Unknown ideal weight.). Liver Function Tests: Recent Labs  Lab 04/15/17 1705  AST 118*  ALT 151*  ALKPHOS 111  BILITOT 2.3*  PROT 6.9  ALBUMIN 3.4*   No results for input(s): LIPASE, AMYLASE in the last 168 hours. No results for input(s): AMMONIA in the last 168 hours.  Coagulation Profile: No results for input(s): INR, PROTIME in the last 168 hours. Cardiac Enzymes: Recent Labs  Lab 04/15/17 1705  TROPONINI <0.03   BNP (last 3 results) No results for input(s): PROBNP in the last 8760 hours. HbA1C: No results for input(s): HGBA1C in the last 72 hours. CBG: No results for input(s): GLUCAP in the last 168 hours. Lipid Profile: No results for input(s): CHOL, HDL, LDLCALC, TRIG, CHOLHDL, LDLDIRECT in the last 72 hours. Thyroid Function Tests: No results for  input(s): TSH, T4TOTAL, FREET4, T3FREE, THYROIDAB in the last 72 hours. Anemia Panel: No results for input(s): VITAMINB12, FOLATE, FERRITIN, TIBC, IRON, RETICCTPCT in the last 72 hours. Urine analysis:    Component Value Date/Time   COLORURINE YELLOW 04/15/2017 1723   APPEARANCEUR CLEAR 04/15/2017 1723   LABSPEC 1.014 04/15/2017 1723   PHURINE 7.0 04/15/2017 1723   GLUCOSEU NEGATIVE 04/15/2017 1723   HGBUR NEGATIVE 04/15/2017 1723   BILIRUBINUR NEGATIVE 04/15/2017 1723   KETONESUR 20 (A) 04/15/2017 1723   PROTEINUR NEGATIVE 04/15/2017 1723   UROBILINOGEN 0.2 11/25/2011 0927   NITRITE NEGATIVE 04/15/2017 1723   LEUKOCYTESUR TRACE (A) 04/15/2017 1723   Sepsis Labs: @LABRCNTIP (procalcitonin:4,lacticidven:4)   Blood culture (routine x 2)     Status: None (Preliminary result)   Collection Time: 04/15/17  5:04 PM  Result Value Ref Range Status   Specimen Description   Final   Culture  Setup Time   Final    GRAM NEGATIVE RODS    Culture GRAM NEGATIVE RODS  Final   Report Status PENDING  Incomplete  Blood Culture ID Panel (Reflexed)     Status: Abnormal   Collection Time: 04/15/17  5:04 PM  Result Value Ref Range Status   Enterococcus species NOT DETECTED NOT DETECTED Final   Listeria monocytogenes NOT DETECTED NOT DETECTED Final   Staphylococcus species NOT DETECTED NOT DETECTED Final   Staphylococcus aureus NOT DETECTED NOT DETECTED Final   Streptococcus species NOT DETECTED NOT DETECTED Final   Streptococcus agalactiae NOT DETECTED NOT DETECTED Final   Streptococcus pneumoniae NOT DETECTED NOT DETECTED Final   Streptococcus pyogenes NOT DETECTED NOT DETECTED Final   Acinetobacter baumannii NOT DETECTED NOT DETECTED Final   Enterobacteriaceae species DETECTED (A) NOT DETECTED Final   Enterobacter cloacae complex NOT DETECTED NOT DETECTED Final   Escherichia coli DETECTED (A) NOT DETECTED Final    Comment: CRITICAL RESULT CALLED TO, READ BACK BY AND VERIFIED WITH: M  LILLISTON,PHARMD AT 1010 04/16/17 BY L BENFIELD    Klebsiella oxytoca NOT DETECTED NOT DETECTED Final   Klebsiella pneumoniae NOT DETECTED NOT DETECTED Final   Proteus species NOT DETECTED NOT DETECTED Final   Serratia marcescens NOT DETECTED NOT DETECTED Final   Carbapenem resistance NOT DETECTED NOT DETECTED Final   Haemophilus influenzae NOT DETECTED NOT DETECTED Final   Neisseria meningitidis NOT DETECTED NOT DETECTED Final   Pseudomonas aeruginosa NOT DETECTED NOT DETECTED Final   Candida albicans NOT DETECTED NOT DETECTED Final   Candida glabrata NOT DETECTED NOT DETECTED Final   Candida krusei NOT DETECTED NOT DETECTED Final   Candida parapsilosis NOT DETECTED NOT DETECTED Final   Candida tropicalis NOT DETECTED NOT DETECTED Final    Comment: Performed at [facility], [address] [place], [place] [address]      Radiology Studies: Dg Chest 2 View Result Date: 04/15/2017 RIGHT middle lobe pneumonia, possible chronic infection. Bibasilar atelectasis/scarring. Followup PA and lateral chest X-ray is recommended in 3-4 weeks following trial of antibiotic therapy  to ensure resolution and exclude underlying malignancy. Tracheal displacement and narrowing concerning for mediastinal mass, possible substernal thyroid. Consider CT chest with contrast on a nonemergent basis.    Scheduled Meds: . amLODipine  10 mg Oral Daily  . enoxaparin   40 mg Subcutaneous QHS  . hydrochlorothiazide  25 mg Oral Daily  . ipratropium-albutero  3 mL Nebulization Q12H  . losartan  100 mg Oral Daily  . methimazole  2.5 mg Oral BID   Continuous Infusions: . sodium chloride 20 mL/hr at 04/15/17 1731  . azithromycin    . cefTRIAXone (ROCEPHIN)  IV       LOS: 0 days    Time spent: 25 minutes  Greater than 50% of the time spent on counseling and coordinating the care.   Manson Passey, MD [facility] Pager [phone]  If 7PM-7AM, please contact night-coverage [email or web address] Password  [email or web address] 04/16/2017, 10:40 AM

## 2017-04-16 NOTE — Progress Notes (Signed)
PHARMACY - PHYSICIAN COMMUNICATION CRITICAL VALUE ALERT - BLOOD CULTURE IDENTIFICATION (BCID)  Results for orders placed or performed during the hospital encounter of 04/15/17  Blood Culture ID Panel (Reflexed) (Collected: 04/15/2017  5:04 PM)  Result Value Ref Range   Enterococcus species NOT DETECTED NOT DETECTED   Listeria monocytogenes NOT DETECTED NOT DETECTED   Staphylococcus species NOT DETECTED NOT DETECTED   Staphylococcus aureus NOT DETECTED NOT DETECTED   Streptococcus species NOT DETECTED NOT DETECTED   Streptococcus agalactiae NOT DETECTED NOT DETECTED   Streptococcus pneumoniae NOT DETECTED NOT DETECTED   Streptococcus pyogenes NOT DETECTED NOT DETECTED   Acinetobacter baumannii NOT DETECTED NOT DETECTED   Enterobacteriaceae species DETECTED (A) NOT DETECTED   Enterobacter cloacae complex NOT DETECTED NOT DETECTED   Escherichia coli DETECTED (A) NOT DETECTED   Klebsiella oxytoca NOT DETECTED NOT DETECTED   Klebsiella pneumoniae NOT DETECTED NOT DETECTED   Proteus species NOT DETECTED NOT DETECTED   Serratia marcescens NOT DETECTED NOT DETECTED   Carbapenem resistance NOT DETECTED NOT DETECTED   Haemophilus influenzae NOT DETECTED NOT DETECTED   Neisseria meningitidis NOT DETECTED NOT DETECTED   Pseudomonas aeruginosa NOT DETECTED NOT DETECTED   Candida albicans NOT DETECTED NOT DETECTED   Candida glabrata NOT DETECTED NOT DETECTED   Candida krusei NOT DETECTED NOT DETECTED   Candida parapsilosis NOT DETECTED NOT DETECTED   Candida tropicalis NOT DETECTED NOT DETECTED    Name of physician (or Provider) Contacted: Dr. Elisabeth Pigeonevine  Changes to prescribed antibiotics required: Increase Ceftriaxone to 2 gr IV q24h  Adalberto ColeNikola Derrich Gaby, PharmD, BCPS Pager [phone] 04/16/2017 1:53 PM

## 2017-04-16 NOTE — Progress Notes (Signed)
PT Cancellation Note  Patient Details Name: Carol Munoz MRN: [id number] DOB: Dec 27, 1924   Cancelled Treatment:    Reason Eval/Treat Not Completed: Patient declined, no reason specified. Will check back another day.    Rebeca AlertJannie Ambriel Gorelick, MPT Pager: [phone]

## 2017-04-17 ENCOUNTER — Other Ambulatory Visit: Payer: Self-pay

## 2017-04-17 LAB — BASIC METABOLIC PANEL
Anion gap: 10 (ref 5–15)
BUN: 13 mg/dL (ref 6–20)
CALCIUM: 8 mg/dL — AB (ref 8.9–10.3)
CO2: 22 mmol/L (ref 22–32)
CREATININE: 0.77 mg/dL (ref 0.44–1.00)
Chloride: 99 mmol/L — ABNORMAL LOW (ref 101–111)
GFR calc Af Amer: >60 mL/min (ref >60)
GLUCOSE: 115 mg/dL — AB (ref 65–99)
Potassium: 2.6 mmol/L — CL (ref 3.5–5.1)
SODIUM: 131 mmol/L — AB (ref 135–145)

## 2017-04-17 LAB — CBC
HCT: 33.3 % — ABNORMAL LOW (ref 36.0–46.0)
Hemoglobin: 11.1 g/dL — ABNORMAL LOW (ref 12.0–15.0)
MCH: 26.2 pg (ref 26.0–34.0)
MCHC: 33.3 g/dL (ref 30.0–36.0)
MCV: 78.7 fL (ref 78.0–100.0)
PLATELETS: 173 10*3/uL (ref 150–400)
RBC: 4.23 MIL/uL (ref 3.87–5.11)
RDW: 15.4 % (ref 11.5–15.5)
WBC: 13.5 10*3/uL — ABNORMAL HIGH (ref 4.0–10.5)

## 2017-04-17 LAB — URINE CULTURE

## 2017-04-17 MED ORDER — POTASSIUM CHLORIDE 10 MEQ/100ML IV SOLN
10.0000 meq | INTRAVENOUS | Status: DC
  Administered 2017-04-17: 10 meq via INTRAVENOUS
  Filled 2017-04-17 (×4): qty 100

## 2017-04-17 MED ORDER — POTASSIUM CHLORIDE CRYS ER 20 MEQ PO TBCR
40.0000 meq | EXTENDED_RELEASE_TABLET | Freq: Once | ORAL | Status: AC
  Administered 2017-04-17: 40 meq via ORAL
  Filled 2017-04-17: qty 2

## 2017-04-17 MED ORDER — POTASSIUM CHLORIDE CRYS ER 20 MEQ PO TBCR
40.0000 meq | EXTENDED_RELEASE_TABLET | Freq: Once | ORAL | Status: AC
  Administered 2017-04-18: 40 meq via ORAL
  Filled 2017-04-17: qty 2

## 2017-04-17 NOTE — Progress Notes (Addendum)
Patient ID: Carol Munoz, female   DOB: 27-May-1924, 84 y.o.   MRN: [id number]  PROGRESS NOTE    Carol Munoz  [id number] DOB: 27-May-1924 DOA: 04/15/2017  PCP: Patient, No Pcp Per   Brief Narrative:   84 y.o. female past medical history negative for hyperthyroid, glaucoma and arthritis presents to the emergency room room with weakness.  She had sudden onset weakness for 1 day.  No cough, congestion, fever, chills, nausea, vomiting, diarrhea.   Pt found to have pneumonia on CXR and was started on empiric abx while awaiting culture results.   Assessment & Plan:   Active Problems:   CAP (community acquired pneumonia) / Leukocytosis  - CXR on admission showed right middle lobe pneumonia, possible chronic infection. Bibasilar atelectasis/scarring. Followup PA and lateral chest X-ray is recommended in 3-4 weeks following trial of antibiotic therapy to ensure resolution and exclude underlying malignancy. Tracheal displacement and narrowing concerning for mediastinal mass, possible substernal thyroid. Consider CT chest with contrast on a nonemergent basis - Continue azithromycin and rocephin - Stable respiratory status     E.Coli bacteremia - Continue Rocephin     Hypokalemia - Due to infection - Continue to supplement     Essential hypertension - Continue Norvasc and Hctz - Continue losartan  - BP 111/57    Hyperthyroidism - Continue methimazole     DVT prophylaxis: Lovenox subQ Code Status: full code  Family Communication: family at the bedside  Disposition Plan: d/c in next 48-72 hours    Consultants:   PT  Procedures:   None  Antimicrobials:   Azithromycin and rocephin 1/31 -->   Subjective: No overnight events.  Objective: Vitals:   04/16/17 2114 04/16/17 2121 04/17/17 0453 04/17/17 1009  BP: 102/63  (!) 111/57   Pulse: 73  91   Resp: 16  14   Temp: 99.7 F (37.6 C)  98.9 F (37.2 C)   TempSrc: Oral  Oral   SpO2: 98% 94% 96% 93%    Intake/Output  Summary (Last 24 hours) at 04/17/2017 1214 Last data filed at 04/17/2017 0950 Gross per 24 hour  Intake 1616 ml  Output 600 ml  Net 1016 ml   There were no vitals filed for this visit.  Physical Exam  Constitutional: Appears well-developed and well-nourished. No distress.  CVS: RRR, S1/S2 + Pulmonary: diminished and coarse breath sounds but no wheezing  Abdominal: Soft. BS +,  no distension, tenderness, rebound or guarding.  Musculoskeletal: Normal range of motion. No tenderness  Lymphadenopathy: No lymphadenopathy noted, cervical, inguinal. Neuro: Alert. Normal reflexes, muscle tone coordination. No cranial nerve deficit. Skin: Skin is warm and dry. No rash noted. Not diaphoretic. No erythema. No pallor.  Psychiatric: Normal mood and affect. Behavior, judgment, thought content normal.     Data Reviewed: I have personally reviewed following labs and imaging studies  CBC: Recent Labs  Lab 04/15/17 1705 04/16/17 0333 04/17/17 0338  WBC 7.6 12.6* 13.5*  NEUTROABS 6.6  --   --   HGB 11.3* 10.2* 11.1*  HCT 34.8* 31.2* 33.3*  MCV 81.3 80.2 78.7  PLT 167 158 173   Basic Metabolic Panel: Recent Labs  Lab 04/15/17 1705 04/16/17 0333 04/17/17 0338  NA 135 135 131*  K 3.0* 3.1* 2.6*  CL 101 102 99*  CO2 24 25 22   GLUCOSE 143* 169* 115*  BUN 19 14 13   CREATININE 0.73 0.73 0.77  CALCIUM 9.0 8.4* 8.0*   GFR: CrCl cannot be calculated (Unknown ideal weight.). Liver Function  Tests: Recent Labs  Lab 04/15/17 1705  AST 118*  ALT 151*  ALKPHOS 111  BILITOT 2.3*  PROT 6.9  ALBUMIN 3.4*   No results for input(s): LIPASE, AMYLASE in the last 168 hours. No results for input(s): AMMONIA in the last 168 hours. Coagulation Profile: No results for input(s): INR, PROTIME in the last 168 hours. Cardiac Enzymes: Recent Labs  Lab 04/15/17 1705  TROPONINI <0.03   BNP (last 3 results) No results for input(s): PROBNP in the last 8760 hours. HbA1C: No results for input(s):  HGBA1C in the last 72 hours. CBG: No results for input(s): GLUCAP in the last 168 hours. Lipid Profile: No results for input(s): CHOL, HDL, LDLCALC, TRIG, CHOLHDL, LDLDIRECT in the last 72 hours. Thyroid Function Tests: No results for input(s): TSH, T4TOTAL, FREET4, T3FREE, THYROIDAB in the last 72 hours. Anemia Panel: No results for input(s): VITAMINB12, FOLATE, FERRITIN, TIBC, IRON, RETICCTPCT in the last 72 hours. Urine analysis:    Component Value Date/Time   COLORURINE YELLOW 04/15/2017 1723   APPEARANCEUR CLEAR 04/15/2017 1723   LABSPEC 1.014 04/15/2017 1723   PHURINE 7.0 04/15/2017 1723   GLUCOSEU NEGATIVE 04/15/2017 1723   HGBUR NEGATIVE 04/15/2017 1723   BILIRUBINUR NEGATIVE 04/15/2017 1723   KETONESUR 20 (A) 04/15/2017 1723   PROTEINUR NEGATIVE 04/15/2017 1723   UROBILINOGEN 0.2 11/25/2011 0927   NITRITE NEGATIVE 04/15/2017 1723   LEUKOCYTESUR TRACE (A) 04/15/2017 1723   Sepsis Labs: @LABRCNTIP (procalcitonin:4,lacticidven:4)   Blood culture (routine x 2)     Status: None (Preliminary result)   Collection Time: 04/15/17  5:04 PM  Result Value Ref Range Status   Specimen Description   Final   Culture  Setup Time   Final    GRAM NEGATIVE RODS    Culture GRAM NEGATIVE RODS  Final   Report Status PENDING  Incomplete  Blood Culture ID Panel (Reflexed)     Status: Abnormal   Collection Time: 04/15/17  5:04 PM  Result Value Ref Range Status   Enterococcus species NOT DETECTED NOT DETECTED Final   Listeria monocytogenes NOT DETECTED NOT DETECTED Final   Staphylococcus species NOT DETECTED NOT DETECTED Final   Staphylococcus aureus NOT DETECTED NOT DETECTED Final   Streptococcus species NOT DETECTED NOT DETECTED Final   Streptococcus agalactiae NOT DETECTED NOT DETECTED Final   Streptococcus pneumoniae NOT DETECTED NOT DETECTED Final   Streptococcus pyogenes NOT DETECTED NOT DETECTED Final   Acinetobacter baumannii NOT DETECTED NOT DETECTED Final   Enterobacteriaceae  species DETECTED (A) NOT DETECTED Final   Enterobacter cloacae complex NOT DETECTED NOT DETECTED Final   Escherichia coli DETECTED (A) NOT DETECTED Final    Comment: CRITICAL RESULT CALLED TO, READ BACK BY AND VERIFIED WITH: M LILLISTON,PHARMD AT 1010 04/16/17 BY L BENFIELD    Klebsiella oxytoca NOT DETECTED NOT DETECTED Final   Klebsiella pneumoniae NOT DETECTED NOT DETECTED Final   Proteus species NOT DETECTED NOT DETECTED Final   Serratia marcescens NOT DETECTED NOT DETECTED Final   Carbapenem resistance NOT DETECTED NOT DETECTED Final   Haemophilus influenzae NOT DETECTED NOT DETECTED Final   Neisseria meningitidis NOT DETECTED NOT DETECTED Final   Pseudomonas aeruginosa NOT DETECTED NOT DETECTED Final   Candida albicans NOT DETECTED NOT DETECTED Final   Candida glabrata NOT DETECTED NOT DETECTED Final   Candida krusei NOT DETECTED NOT DETECTED Final   Candida parapsilosis NOT DETECTED NOT DETECTED Final   Candida tropicalis NOT DETECTED NOT DETECTED Final    Comment: Performed at [facility]  Hospital Lab, [address] [place], [place] [address]      Radiology Studies: Dg Chest 2 View Result Date: 04/15/2017 RIGHT middle lobe pneumonia, possible chronic infection. Bibasilar atelectasis/scarring. Followup PA and lateral chest X-ray is recommended in 3-4 weeks following trial of antibiotic therapy to ensure resolution and exclude underlying malignancy. Tracheal displacement and narrowing concerning for mediastinal mass, possible substernal thyroid. Consider CT chest with contrast on a nonemergent basis.    Scheduled Meds: . amLODipine  10 mg Oral Daily  . enoxaparin   40 mg Subcutaneous QHS  . hydrochlorothiazide  25 mg Oral Daily  . ipratropium-albutero  3 mL Nebulization Q12H  . losartan  100 mg Oral Daily  . methimazole  2.5 mg Oral BID   Continuous Infusions: . sodium chloride 20 mL/hr at 04/15/17 1731  . azithromycin Stopped (04/16/17 2219)  . cefTRIAXone (ROCEPHIN)  IV 2 g  (04/17/17 1119)     LOS: 1 day    Time spent: 25 minutes  Greater than 50% of the time spent on counseling and coordinating the care.   Manson Passey, MD [facility] Pager [phone]  If 7PM-7AM, please contact night-coverage [email or web address] Password [facility] 04/17/2017, 12:14 PM

## 2017-04-17 NOTE — Evaluation (Signed)
Physical Therapy Evaluation Patient Details Name: Carol Munoz MRN: [id number] DOB: April 03, 1924 Today's Date: 04/17/2017   History of Present Illness  84 y.o.femalepast medical history for hyperthyroid, glaucoma and arthritis presents to the emergency room room 04/15/17 with weakness. Pt found to have pneumonia on CXR   Clinical Impression  The patient is very pleasant and adamant to go home. Patient should progress to return home with family caregivers. Pt admitted with above diagnosis. Pt currently with functional limitations due to the deficits listed below (see PT Problem List). Pt will benefit from skilled PT to increase their independence and safety with mobility to allow discharge to the venue listed below.       Follow Up Recommendations Home health PT    Equipment Recommendations  None recommended by PT    Recommendations for Other Services       Precautions / Restrictions Precautions Precautions: Fall      Mobility  Bed Mobility Overal bed mobility: Needs Assistance Bed Mobility: Supine to Sit     Supine to sit: Min assist     General bed mobility comments: extra time to mobilize to bed edge. Patient wanted to self assist as much as she could, Gentle assist for trunk.  Transfers Overall transfer level: Needs assistance Equipment used: Rolling walker (2 wheeled) Transfers: Sit to/from [employer] Sit to Stand: Min assist Stand pivot transfers: Min assist       General transfer comment: extra time for standing, sloww small steps to recliner.  Ambulation/Gait                Stairs            Wheelchair Mobility    Modified Rankin (Stroke Patients Only)       Balance                                             Pertinent Vitals/Pain Pain Assessment: No/denies pain    Home Living Family/patient expects to be discharged to:: Private residence Living Arrangements: Children Available Help at Discharge:  Family;Friend(s);Available 24 hours/day Type of Home: House Home Access: Stairs to enter Entrance Stairs-Rails: Doctor, general practice of Steps: 5 Home Layout: One level Home Equipment: Walker - 2 wheels;Walker - 4 wheels      Prior Function Level of Independence: Independent with assistive device(s)               Hand Dominance        Extremity/Trunk Assessment        Lower Extremity Assessment Lower Extremity Assessment: RLE deficits/detail RLE Deficits / Details: noted decreased knee extension    Cervical / Trunk Assessment Cervical / Trunk Assessment: Kyphotic  Communication   Communication: No difficulties  Cognition Arousal/Alertness: Awake/alert Behavior During Therapy: WFL for tasks assessed/performed Overall Cognitive Status: Within Functional Limits for tasks assessed                                        General Comments      Exercises     Assessment/Plan    PT Assessment Patient needs continued PT services  PT Problem List Decreased strength;Decreased activity tolerance;Decreased mobility;Decreased knowledge of precautions;Decreased knowledge of use of DME;Decreased safety awareness       PT Treatment Interventions DME  instruction;Therapeutic exercise;Gait training;Functional mobility training;Therapeutic activities;Patient/family education    PT Goals (Current goals can be found in the Care Plan section)  Acute Rehab PT Goals Patient Stated Goal: to go home PT Goal Formulation: With patient Time For Goal Achievement: 05/01/17 Potential to Achieve Goals: Good    Frequency Min 3X/week   Barriers to discharge        Co-evaluation               AM-PAC PT "6 Clicks" Daily Activity  Outcome Measure Difficulty turning over in bed (including adjusting bedclothes, sheets and blankets)?: A Lot Difficulty moving from lying on back to sitting on the side of the bed? : A Lot Difficulty sitting down on and  standing up from a chair with arms (e.g., wheelchair, bedside commode, etc,.)?: A Lot Help needed moving to and from a bed to chair (including a wheelchair)?: A Lot Help needed walking in hospital room?: Total Help needed climbing 3-5 steps with a railing? : Total 6 Click Score: 10    End of Session   Activity Tolerance: Patient tolerated treatment well Patient left: in chair;with call bell/phone within reach;with chair alarm set;with family/visitor present Nurse Communication: Mobility status PT Visit Diagnosis: Unsteadiness on feet (R26.81)    Time: [id number] PT Time Calculation (min) (ACUTE ONLY): 25 min   Charges:   PT Evaluation $PT Eval Low Complexity: 1 Low PT Treatments $Therapeutic Activity: 8-22 mins   PT G CodesBlanchard Kelch:        Hamed Debella PT [phone]   Rada HayHill, Mickey Esguerra Elizabeth 04/17/2017, 5:10 PM

## 2017-04-17 NOTE — Plan of Care (Signed)
Pt critical lab result of potassium 2.6 was called and reported at 0616 to Dr. Rana SnareBodenheimer. No further order

## 2017-04-17 NOTE — Progress Notes (Signed)
PT Cancellation Note  Patient Details Name: Carol SchimkeRuth Munoz MRN: [id number] DOB: [birth year]   Cancelled Treatment:    Reason Eval/Treat Not Completed: Medical issues which prohibited therapy, receiving potassium IV. Will check back afternoon per RN recommendation.    Rada HayHill, Ignazio Kincaid Elizabeth 04/17/2017, 8:49 AM

## 2017-04-18 LAB — CULTURE, BLOOD (ROUTINE X 2)
SPECIAL REQUESTS: ADEQUATE
Special Requests: ADEQUATE

## 2017-04-18 LAB — BASIC METABOLIC PANEL
Anion gap: 9 (ref 5–15)
BUN: 20 mg/dL (ref 6–20)
CHLORIDE: 99 mmol/L — AB (ref 101–111)
CO2: 23 mmol/L (ref 22–32)
Calcium: 8.1 mg/dL — ABNORMAL LOW (ref 8.9–10.3)
Creatinine, Ser: 0.97 mg/dL (ref 0.44–1.00)
GFR calc non Af Amer: 50 mL/min — ABNORMAL LOW (ref >60)
GFR, EST AFRICAN AMERICAN: 58 mL/min — AB (ref >60)
GLUCOSE: 94 mg/dL (ref 65–99)
POTASSIUM: 2.8 mmol/L — AB (ref 3.5–5.1)
Sodium: 131 mmol/L — ABNORMAL LOW (ref 135–145)

## 2017-04-18 LAB — CBC
HEMATOCRIT: 32.5 % — AB (ref 36.0–46.0)
HEMOGLOBIN: 10.9 g/dL — AB (ref 12.0–15.0)
MCH: 26.2 pg (ref 26.0–34.0)
MCHC: 33.5 g/dL (ref 30.0–36.0)
MCV: 78.1 fL (ref 78.0–100.0)
Platelets: 194 10*3/uL (ref 150–400)
RBC: 4.16 MIL/uL (ref 3.87–5.11)
RDW: 15.5 % (ref 11.5–15.5)
WBC: 10.3 10*3/uL (ref 4.0–10.5)

## 2017-04-18 MED ORDER — POTASSIUM CHLORIDE CRYS ER 20 MEQ PO TBCR
40.0000 meq | EXTENDED_RELEASE_TABLET | Freq: Once | ORAL | Status: AC
  Administered 2017-04-18: 40 meq via ORAL
  Filled 2017-04-18: qty 2

## 2017-04-18 NOTE — Progress Notes (Signed)
Patient ID: Carol SchimkeRuth Munoz, female   DOB: May 11, 1924, 84 y.o.   MRN: [id number]  PROGRESS NOTE    Carol Munoz  WGN:[id number] DOB: May 11, 1924 DOA: 04/15/2017  PCP: Patient, No Pcp Per   Brief Narrative:   84 y.o. female past medical history negative for hyperthyroid, glaucoma and arthritis presents to the emergency room room with weakness.  She had sudden onset weakness for 1 day.  No cough, congestion, fever, chills, nausea, vomiting, diarrhea.   Pt found to have pneumonia on CXR and was started on empiric abx while awaiting culture results.   Assessment & Plan:   Active Problems:   CAP (community acquired pneumonia) / Leukocytosis  - CXR on admission showed right middle lobe pneumonia, possible chronic infection. Bibasilar atelectasis/scarring. Followup PA and lateral chest X-ray is recommended in 3-4 weeks following trial of antibiotic therapy to ensure resolution and exclude underlying malignancy. Tracheal displacement and narrowing concerning for mediastinal mass, possible substernal thyroid. Consider CT chest with contrast on a nonemergent basis - Continue azithromycin and rocephin - Feels little short of breath with exertion     E.Coli bacteremia - Pt on rocephin     Hypokalemia - Supplemented     Essential hypertension - Continue current BP meds    Hyperthyroidism - Continue methimazole     DVT prophylaxis: Lovenox subQ Code Status: full code  Family Communication: family at bedside Disposition Plan: d/c in next 2-3 days   Consultants:   PT - HH  Procedures:   None  Antimicrobials:   Azithromycin and rocephin 1/31 -->   Subjective: No overnight events.  Objective: Vitals:   04/18/17 0406 04/18/17 1029 04/18/17 1030 04/18/17 1045  BP: (!) 98/54   (!) 106/44  Pulse: 79     Resp: 16     Temp: 98.7 F (37.1 C)     TempSrc: Oral     SpO2: 92% 93% 93%     Intake/Output Summary (Last 24 hours) at 04/18/2017 1337 Last data filed at 04/18/2017 1227 Gross  per 24 hour  Intake 720 ml  Output 1400 ml  Net -680 ml   There were no vitals filed for this visit.  Physical Exam  Constitutional: Appears well-developed and well-nourished. No distress.  CVS: RRR, S1/S2 + Pulmonary: coarse breath sounds, no wheezing  Abdominal: Soft. BS +,  no distension, tenderness, rebound or guarding.  Musculoskeletal: Normal range of motion. No edema and no tenderness.  Lymphadenopathy: No lymphadenopathy noted, cervical, inguinal. Neuro: Alert. No cranial nerve deficit. Skin: Skin is warm and dry.  Psychiatric: Normal mood and affect.   Data Reviewed: I have personally reviewed following labs and imaging studies  CBC: Recent Labs  Lab 04/15/17 1705 04/16/17 0333 04/17/17 0338 04/18/17 0349  WBC 7.6 12.6* 13.5* 10.3  NEUTROABS 6.6  --   --   --   HGB 11.3* 10.2* 11.1* 10.9*  HCT 34.8* 31.2* 33.3* 32.5*  MCV 81.3 80.2 78.7 78.1  PLT 167 158 173 194   Basic Metabolic Panel: Recent Labs  Lab 04/15/17 1705 04/16/17 0333 04/17/17 0338 04/18/17 0349  NA 135 135 131* 131*  K 3.0* 3.1* 2.6* 2.8*  CL 101 102 99* 99*  CO2 24 25 22 23   GLUCOSE 143* 169* 115* 94  BUN 19 14 13 20   CREATININE 0.73 0.73 0.77 0.97  CALCIUM 9.0 8.4* 8.0* 8.1*   GFR: CrCl cannot be calculated (Unknown ideal weight.). Liver Function Tests: Recent Labs  Lab 04/15/17 1705  AST 118*  ALT  151*  ALKPHOS 111  BILITOT 2.3*  PROT 6.9  ALBUMIN 3.4*   No results for input(s): LIPASE, AMYLASE in the last 168 hours. No results for input(s): AMMONIA in the last 168 hours. Coagulation Profile: No results for input(s): INR, PROTIME in the last 168 hours. Cardiac Enzymes: Recent Labs  Lab 04/15/17 1705  TROPONINI <0.03   BNP (last 3 results) No results for input(s): PROBNP in the last 8760 hours. HbA1C: No results for input(s): HGBA1C in the last 72 hours. CBG: No results for input(s): GLUCAP in the last 168 hours. Lipid Profile: No results for input(s): CHOL, HDL,  LDLCALC, TRIG, CHOLHDL, LDLDIRECT in the last 72 hours. Thyroid Function Tests: No results for input(s): TSH, T4TOTAL, FREET4, T3FREE, THYROIDAB in the last 72 hours. Anemia Panel: No results for input(s): VITAMINB12, FOLATE, FERRITIN, TIBC, IRON, RETICCTPCT in the last 72 hours. Urine analysis:    Component Value Date/Time   COLORURINE YELLOW 04/15/2017 1723   APPEARANCEUR CLEAR 04/15/2017 1723   LABSPEC 1.014 04/15/2017 1723   PHURINE 7.0 04/15/2017 1723   GLUCOSEU NEGATIVE 04/15/2017 1723   HGBUR NEGATIVE 04/15/2017 1723   BILIRUBINUR NEGATIVE 04/15/2017 1723   KETONESUR 20 (A) 04/15/2017 1723   PROTEINUR NEGATIVE 04/15/2017 1723   UROBILINOGEN 0.2 11/25/2011 0927   NITRITE NEGATIVE 04/15/2017 1723   LEUKOCYTESUR TRACE (A) 04/15/2017 1723   Sepsis Labs: @LABRCNTIP (procalcitonin:4,lacticidven:4)   Blood culture (routine x 2)     Status: None (Preliminary result)   Collection Time: 04/15/17  5:04 PM  Result Value Ref Range Status   Specimen Description   Final   Culture  Setup Time   Final    GRAM NEGATIVE RODS    Culture GRAM NEGATIVE RODS  Final   Report Status PENDING  Incomplete  Blood Culture ID Panel (Reflexed)     Status: Abnormal   Collection Time: 04/15/17  5:04 PM  Result Value Ref Range Status   Enterococcus species NOT DETECTED NOT DETECTED Final   Listeria monocytogenes NOT DETECTED NOT DETECTED Final   Staphylococcus species NOT DETECTED NOT DETECTED Final   Staphylococcus aureus NOT DETECTED NOT DETECTED Final   Streptococcus species NOT DETECTED NOT DETECTED Final   Streptococcus agalactiae NOT DETECTED NOT DETECTED Final   Streptococcus pneumoniae NOT DETECTED NOT DETECTED Final   Streptococcus pyogenes NOT DETECTED NOT DETECTED Final   Acinetobacter baumannii NOT DETECTED NOT DETECTED Final   Enterobacteriaceae species DETECTED (A) NOT DETECTED Final   Enterobacter cloacae complex NOT DETECTED NOT DETECTED Final   Escherichia coli DETECTED (A) NOT  DETECTED Final    Comment: CRITICAL RESULT CALLED TO, READ BACK BY AND VERIFIED WITH: M LILLISTON,PHARMD AT 1010 04/16/17 BY L BENFIELD    Klebsiella oxytoca NOT DETECTED NOT DETECTED Final   Klebsiella pneumoniae NOT DETECTED NOT DETECTED Final   Proteus species NOT DETECTED NOT DETECTED Final   Serratia marcescens NOT DETECTED NOT DETECTED Final   Carbapenem resistance NOT DETECTED NOT DETECTED Final   Haemophilus influenzae NOT DETECTED NOT DETECTED Final   Neisseria meningitidis NOT DETECTED NOT DETECTED Final   Pseudomonas aeruginosa NOT DETECTED NOT DETECTED Final   Candida albicans NOT DETECTED NOT DETECTED Final   Candida glabrata NOT DETECTED NOT DETECTED Final   Candida krusei NOT DETECTED NOT DETECTED Final   Candida parapsilosis NOT DETECTED NOT DETECTED Final   Candida tropicalis NOT DETECTED NOT DETECTED Final    Comment: Performed at [facility], [address] [place], [place] [address]  Radiology Studies: Dg Chest 2 View Result Date: 04/15/2017 RIGHT middle lobe pneumonia, possible chronic infection. Bibasilar atelectasis/scarring. Followup PA and lateral chest X-ray is recommended in 3-4 weeks following trial of antibiotic therapy to ensure resolution and exclude underlying malignancy. Tracheal displacement and narrowing concerning for mediastinal mass, possible substernal thyroid. Consider CT chest with contrast on a nonemergent basis.    Scheduled Meds: . amLODipine  10 mg Oral Daily  . enoxaparin   40 mg Subcutaneous QHS  . hydrochlorothiazide  25 mg Oral Daily  . ipratropium-albutero  3 mL Nebulization Q12H  . losartan  100 mg Oral Daily  . methimazole  2.5 mg Oral BID   Continuous Infusions: . sodium chloride 20 mL/hr at 04/17/17 1809  . azithromycin Stopped (04/17/17 2036)  . cefTRIAXone (ROCEPHIN)  IV 2 g (04/18/17 1320)     LOS: 2 days    Time spent: 25 minutes  Greater than 50% of the time spent on counseling and coordinating the  care.   Manson Passey, MD [facility] Pager [phone]  If 7PM-7AM, please contact night-coverage [email or web address] Password [email or web address] 04/18/2017, 1:37 PM

## 2017-04-19 LAB — CBC
HEMATOCRIT: 29.7 % — AB (ref 36.0–46.0)
HEMOGLOBIN: 10.2 g/dL — AB (ref 12.0–15.0)
MCH: 26.8 pg (ref 26.0–34.0)
MCHC: 34.3 g/dL (ref 30.0–36.0)
MCV: 78 fL (ref 78.0–100.0)
Platelets: 208 10*3/uL (ref 150–400)
RBC: 3.81 MIL/uL — ABNORMAL LOW (ref 3.87–5.11)
RDW: 15.8 % — ABNORMAL HIGH (ref 11.5–15.5)
WBC: 9.5 10*3/uL (ref 4.0–10.5)

## 2017-04-19 LAB — MAGNESIUM: Magnesium: 2 mg/dL (ref 1.7–2.4)

## 2017-04-19 LAB — BASIC METABOLIC PANEL
ANION GAP: 5 (ref 5–15)
BUN: 21 mg/dL — ABNORMAL HIGH (ref 6–20)
CHLORIDE: 106 mmol/L (ref 101–111)
CO2: 24 mmol/L (ref 22–32)
Calcium: 7.9 mg/dL — ABNORMAL LOW (ref 8.9–10.3)
Creatinine, Ser: 0.88 mg/dL (ref 0.44–1.00)
GFR calc Af Amer: >60 mL/min (ref >60)
GFR calc non Af Amer: 56 mL/min — ABNORMAL LOW (ref >60)
GLUCOSE: 101 mg/dL — AB (ref 65–99)
POTASSIUM: 3.7 mmol/L (ref 3.5–5.1)
Sodium: 135 mmol/L (ref 135–145)

## 2017-04-19 LAB — STREP PNEUMONIAE URINARY ANTIGEN: STREP PNEUMO URINARY ANTIGEN: NEGATIVE

## 2017-04-19 NOTE — Progress Notes (Signed)
Physical Therapy Treatment Patient Details Name: Carol Munoz MRN: [id number] DOB: 1924/06/30 Today's Date: 04/19/2017    History of Present Illness 84 y.o.femalepast medical history for hyperthyroid, glaucoma and arthritis presents to the emergency room room 04/15/17 with weakness. Pt found to have pneumonia on CXR     PT Comments    Patient progressing this session to ambulate some in the room.  Noted daughter works during the day.  Patient unsafe to be home alone.  If other family can cover hours daughter works could still go home with HHPT, but otherwise, may need SNF level rehab prior to d/c home.  Reports at baseline making her bed and her own breakfast, but currently not stable to brush her teeth without minguard assist and unable to ambulate further than a few feet.  PT to follow acutely.    Follow Up Recommendations  Supervision/Assistance - 24 hour;SNF     Equipment Recommendations  None recommended by PT    Recommendations for Other Services       Precautions / Restrictions Precautions Precautions: Fall    Mobility  Bed Mobility Overal bed mobility: Needs Assistance Bed Mobility: Supine to Sit     Supine to sit: Min assist     General bed mobility comments: assist for trunk, increased time to scoot to EOB  Transfers Overall transfer level: Needs assistance Equipment used: Rolling walker (2 wheeled) Transfers: Sit to/from [employer] Sit to Stand: Min assist Stand pivot transfers: Min assist       General transfer comment: increased time for transfers due to limitations in R knee ROM, assist to steady while using walker to step to [facility]  Ambulation/Gait Ambulation/Gait assistance: Min assist Ambulation Distance (Feet): 6 Feet Assistive device: Rolling walker (2 wheeled) Gait Pattern/deviations: Step-to pattern;Decreased stride length;Shuffle;Decreased stance time - right     General Gait Details: ginger on R LE with ambulation in room and  limited due to pain and weakness   Stairs            Wheelchair Mobility    Modified Rankin (Stroke Patients Only)       Balance Overall balance assessment: Needs assistance   Sitting balance-Leahy Scale: Good     Standing balance support: Bilateral upper extremity supported Standing balance-Leahy Scale: Poor Standing balance comment: assist to stabilize while standing at sink to brush teeth with intermittent UE support                            Cognition Arousal/Alertness: Awake/alert Behavior During Therapy: WFL for tasks assessed/performed Overall Cognitive Status: Within Functional Limits for tasks assessed                                        Exercises General Exercises - Lower Extremity Ankle Circles/Pumps: AROM;15 reps;Supine;Both Short Arc Quad: AROM;15 reps;Both;Supine Heel Slides: AROM;AAROM;Both;5 reps;Supine Hip ABduction/ADduction: AROM;Both;5 reps;Supine    General Comments General comments (skin integrity, edema, etc.): Toileted on BSC, assist for hygiene in standing due to decreased balance.  Reports at baseline makes her own bed and breakfast.      Pertinent Vitals/Pain Pain Assessment: Faces Faces Pain Scale: Hurts little more Pain Location: R knee with mobility Pain Descriptors / Indicators: Sore Pain Intervention(s): Monitored during session;Repositioned    Home Living  Prior Function            PT Goals (current goals can now be found in the care plan section) Progress towards PT goals: Progressing toward goals    Frequency    Min 3X/week      PT Plan Current plan remains appropriate    Co-evaluation              AM-PAC PT "6 Clicks" Daily Activity  Outcome Measure  Difficulty turning over in bed (including adjusting bedclothes, sheets and blankets)?: A Lot Difficulty moving from lying on back to sitting on the side of the bed? : Unable Difficulty  sitting down on and standing up from a chair with arms (e.g., wheelchair, bedside commode, etc,.)?: Unable Help needed moving to and from a bed to chair (including a wheelchair)?: A Little Help needed walking in hospital room?: A Little Help needed climbing 3-5 steps with a railing? : Total 6 Click Score: 11    End of Session Equipment Utilized During Treatment: Gait belt Activity Tolerance: Patient limited by pain Patient left: with call bell/phone within reach;with family/visitor present;in chair   PT Visit Diagnosis: Unsteadiness on feet (R26.81);Other abnormalities of gait and mobility (R26.89);Muscle weakness (generalized) (M62.81)     Time: [id number] PT Time Calculation (min) (ACUTE ONLY): 27 min  Charges:  $Gait Training: 8-22 mins $Therapeutic Activity: 8-22 mins                    G CodesSheran Lawless:       Cyndi Joanny Dupree, [place] CarolinaPT [phone] 04/19/2017    Elray Mcgregorynthia Daren Yeagle 04/19/2017, 4:24 PM

## 2017-04-19 NOTE — Progress Notes (Signed)
This CM went over PT recommendations at bedside with pt. Pt states her daughter lives with her but she works during the day. Pt unsure if she would like home health services and would like to speak with her daughter about it first. This CM left home health provider list at bedside and will continue to follow. Sandford Crazeora Yuliza Cara RN,BSN,NCM [phone]

## 2017-04-19 NOTE — Care Management Important Message (Signed)
Important Message  Patient Details  Name: Carol Munoz MRN: [id number] Date of Birth: October 25, 1924   Medicare Important Message Given:  Yes    Caren MacadamFuller, Kamaree Berkel 04/19/2017, 12:48 PMImportant Message  Patient Details  Name: Carol Munoz MRN: [id number] Date of Birth: October 25, 1924   Medicare Important Message Given:  Yes    Caren MacadamFuller, Mena Simonis 04/19/2017, 12:48 PM

## 2017-04-19 NOTE — Progress Notes (Addendum)
Patient ID: Carol Munoz, female   DOB: April 24, 1924, 84 y.o.   MRN: [id number]  PROGRESS NOTE    Carol Munoz  BJY:[id number] DOB: 1924-10-19 DOA: 04/15/2017  PCP: Patient, No Pcp Per   Brief Narrative:   84 y.o. female past medical history negative for hyperthyroid, glaucoma and arthritis presents to the emergency room room with weakness.  She had sudden onset weakness for 1 day.  No cough, congestion, fever, chills, nausea, vomiting, diarrhea.   Pt found to have pneumonia on CXR and was started on empiric abx while awaiting culture results.   Assessment & Plan:   Active Problems:   CAP (community acquired pneumonia) / Leukocytosis  - CXR on admission showed right middle lobe pneumonia, possible chronic infection. Bibasilar atelectasis/scarring. Followup PA and lateral chest X-ray is recommended in 3-4 weeks following trial of antibiotic therapy to ensure resolution and exclude underlying malignancy. Tracheal displacement and narrowing concerning for mediastinal mass, possible substernal thyroid. Consider CT chest with contrast on a nonemergent basis - Continue azithromycin and Rocephin - Respiratory status improving     E.Coli bacteremia - Continue Rocephin    Hypokalemia - Supplemented within normal limits    Essential hypertension - Continue Norvasc, Hctz and losartan     Hyperthyroidism - Continue methimazole     DVT prophylaxis: Lovenox subcutaneous Code Status: full code  Family Communication: Family at bedside Disposition Plan: Anticipate discharge tomorrow   Consultants:   PT - HH  Procedures:   None  Antimicrobials:   Azithromycin and rocephin 1/31 -->   Subjective: No overnight events.  Objective: Vitals:   04/18/17 1045 04/18/17 1343 04/18/17 2255 04/19/17 0554  BP: (!) 106/44 (!) 107/54 (!) 90/55 (!) 104/49  Pulse:  83 65 73  Resp:  16 20 20   Temp:  99.3 F (37.4 C) 98.2 F (36.8 C) 99.3 F (37.4 C)  TempSrc:  Oral Oral Oral  SpO2:  95% 98%  96%    Intake/Output Summary (Last 24 hours) at 04/19/2017 1016 Last data filed at 04/19/2017 0847 Gross per 24 hour  Intake 240 ml  Output 1600 ml  Net -1360 ml   There were no vitals filed for this visit.  Physical Exam  Constitutional: Appears well-developed and well-nourished. No distress.  CVS: RRR, S1/S2 (+) Pulmonary: no wheezing but some coarse sounds in mid lobes  Abdominal: Soft. BS +,  no distension, tenderness, rebound or guarding.  Musculoskeletal: Normal range of motion. No edema and no tenderness.  Lymphadenopathy: No lymphadenopathy noted, cervical, inguinal. Neuro: Alert. Normal reflexes, muscle tone coordination. No cranial nerve deficit. Skin: Skin is warm and dry. No rash noted. Not diaphoretic. No erythema. No pallor.  Psychiatric: Normal mood and affect. Behavior, judgment, thought content normal.    Data Reviewed: I have personally reviewed following labs and imaging studies  CBC: Recent Labs  Lab 04/15/17 1705 04/16/17 0333 04/17/17 0338 04/18/17 0349 04/19/17 0341  WBC 7.6 12.6* 13.5* 10.3 9.5  NEUTROABS 6.6  --   --   --   --   HGB 11.3* 10.2* 11.1* 10.9* 10.2*  HCT 34.8* 31.2* 33.3* 32.5* 29.7*  MCV 81.3 80.2 78.7 78.1 78.0  PLT 167 158 173 194 208   Basic Metabolic Panel: Recent Labs  Lab 04/15/17 1705 04/16/17 0333 04/17/17 0338 04/18/17 0349 04/19/17 0341  NA 135 135 131* 131* 135  K 3.0* 3.1* 2.6* 2.8* 3.7  CL 101 102 99* 99* 106  CO2 24 25 22 23 24   GLUCOSE 143* 169*  115* 94 101*  BUN 19 14 13 20  21*  CREATININE 0.73 0.73 0.77 0.97 0.88  CALCIUM 9.0 8.4* 8.0* 8.1* 7.9*  MG  --   --   --   --  2.0   GFR: CrCl cannot be calculated (Unknown ideal weight.). Liver Function Tests: Recent Labs  Lab 04/15/17 1705  AST 118*  ALT 151*  ALKPHOS 111  BILITOT 2.3*  PROT 6.9  ALBUMIN 3.4*   No results for input(s): LIPASE, AMYLASE in the last 168 hours. No results for input(s): AMMONIA in the last 168 hours. Coagulation  Profile: No results for input(s): INR, PROTIME in the last 168 hours. Cardiac Enzymes: Recent Labs  Lab 04/15/17 1705  TROPONINI <0.03   BNP (last 3 results) No results for input(s): PROBNP in the last 8760 hours. HbA1C: No results for input(s): HGBA1C in the last 72 hours. CBG: No results for input(s): GLUCAP in the last 168 hours. Lipid Profile: No results for input(s): CHOL, HDL, LDLCALC, TRIG, CHOLHDL, LDLDIRECT in the last 72 hours. Thyroid Function Tests: No results for input(s): TSH, T4TOTAL, FREET4, T3FREE, THYROIDAB in the last 72 hours. Anemia Panel: No results for input(s): VITAMINB12, FOLATE, FERRITIN, TIBC, IRON, RETICCTPCT in the last 72 hours. Urine analysis:    Component Value Date/Time   COLORURINE YELLOW 04/15/2017 1723   APPEARANCEUR CLEAR 04/15/2017 1723   LABSPEC 1.014 04/15/2017 1723   PHURINE 7.0 04/15/2017 1723   GLUCOSEU NEGATIVE 04/15/2017 1723   HGBUR NEGATIVE 04/15/2017 1723   BILIRUBINUR NEGATIVE 04/15/2017 1723   KETONESUR 20 (A) 04/15/2017 1723   PROTEINUR NEGATIVE 04/15/2017 1723   UROBILINOGEN 0.2 11/25/2011 0927   NITRITE NEGATIVE 04/15/2017 1723   LEUKOCYTESUR TRACE (A) 04/15/2017 1723   Sepsis Labs: @LABRCNTIP (procalcitonin:4,lacticidven:4)   Blood culture (routine x 2)     Status: None (Preliminary result)   Collection Time: 04/15/17  5:04 PM  Result Value Ref Range Status   Specimen Description   Final   Culture  Setup Time   Final    GRAM NEGATIVE RODS    Culture GRAM NEGATIVE RODS  Final   Report Status PENDING  Incomplete  Blood Culture ID Panel (Reflexed)     Status: Abnormal   Collection Time: 04/15/17  5:04 PM  Result Value Ref Range Status   Enterococcus species NOT DETECTED NOT DETECTED Final   Listeria monocytogenes NOT DETECTED NOT DETECTED Final   Staphylococcus species NOT DETECTED NOT DETECTED Final   Staphylococcus aureus NOT DETECTED NOT DETECTED Final   Streptococcus species NOT DETECTED NOT DETECTED Final    Streptococcus agalactiae NOT DETECTED NOT DETECTED Final   Streptococcus pneumoniae NOT DETECTED NOT DETECTED Final   Streptococcus pyogenes NOT DETECTED NOT DETECTED Final   Acinetobacter baumannii NOT DETECTED NOT DETECTED Final   Enterobacteriaceae species DETECTED (A) NOT DETECTED Final   Enterobacter cloacae complex NOT DETECTED NOT DETECTED Final   Escherichia coli DETECTED (A) NOT DETECTED Final    Comment: CRITICAL RESULT CALLED TO, READ BACK BY AND VERIFIED WITH: M LILLISTON,PHARMD AT 1010 04/16/17 BY L BENFIELD    Klebsiella oxytoca NOT DETECTED NOT DETECTED Final   Klebsiella pneumoniae NOT DETECTED NOT DETECTED Final   Proteus species NOT DETECTED NOT DETECTED Final   Serratia marcescens NOT DETECTED NOT DETECTED Final   Carbapenem resistance NOT DETECTED NOT DETECTED Final   Haemophilus influenzae NOT DETECTED NOT DETECTED Final   Neisseria meningitidis NOT DETECTED NOT DETECTED Final   Pseudomonas aeruginosa NOT DETECTED NOT DETECTED Final  Candida albicans NOT DETECTED NOT DETECTED Final   Candida glabrata NOT DETECTED NOT DETECTED Final   Candida krusei NOT DETECTED NOT DETECTED Final   Candida parapsilosis NOT DETECTED NOT DETECTED Final   Candida tropicalis NOT DETECTED NOT DETECTED Final    Comment: Performed at [facility], [address], [place], [place] [address]      Radiology Studies: Dg Chest 2 View Result Date: 04/15/2017 RIGHT middle lobe pneumonia, possible chronic infection. Bibasilar atelectasis/scarring. Followup PA and lateral chest X-ray is recommended in 3-4 weeks following trial of antibiotic therapy to ensure resolution and exclude underlying malignancy. Tracheal displacement and narrowing concerning for mediastinal mass, possible substernal thyroid. Consider CT chest with contrast on a nonemergent basis.    Scheduled Meds: . amLODipine  10 mg Oral Daily  . enoxaparin   40 mg Subcutaneous QHS  . hydrochlorothiazide  25 mg Oral Daily  .  ipratropium-albutero  3 mL Nebulization Q12H  . losartan  100 mg Oral Daily  . methimazole  2.5 mg Oral BID   Continuous Infusions: . sodium chloride 20 mL/hr at 04/17/17 1809  . azithromycin Stopped (04/18/17 2043)  . cefTRIAXone (ROCEPHIN)  IV Stopped (04/18/17 1435)     LOS: 3 days    Time spent: 25 minutes  Greater than 50% of the time spent on counseling and coordinating the care.   Manson Passey, MD [facility] Pager [phone]  If 7PM-7AM, please contact night-coverage [email or web address] Password [email or web address] 04/19/2017, 10:16 AM

## 2017-04-20 LAB — BASIC METABOLIC PANEL
Anion gap: 9 (ref 5–15)
BUN: 15 mg/dL (ref 6–20)
CHLORIDE: 105 mmol/L (ref 101–111)
CO2: 22 mmol/L (ref 22–32)
Calcium: 8.1 mg/dL — ABNORMAL LOW (ref 8.9–10.3)
Creatinine, Ser: 0.65 mg/dL (ref 0.44–1.00)
Glucose, Bld: 106 mg/dL — ABNORMAL HIGH (ref 65–99)
POTASSIUM: 3.4 mmol/L — AB (ref 3.5–5.1)
SODIUM: 136 mmol/L (ref 135–145)

## 2017-04-20 LAB — CBC
HCT: 33.9 % — ABNORMAL LOW (ref 36.0–46.0)
Hemoglobin: 11.2 g/dL — ABNORMAL LOW (ref 12.0–15.0)
MCH: 26.1 pg (ref 26.0–34.0)
MCHC: 33 g/dL (ref 30.0–36.0)
MCV: 79 fL (ref 78.0–100.0)
PLATELETS: 263 10*3/uL (ref 150–400)
RBC: 4.29 MIL/uL (ref 3.87–5.11)
RDW: 15.9 % — AB (ref 11.5–15.5)
WBC: 9.6 10*3/uL (ref 4.0–10.5)

## 2017-04-20 MED ORDER — ACETAMINOPHEN 325 MG PO TABS: 650.0000 mg | ORAL_TABLET | Freq: Four times a day (QID) | ORAL | 0 refills | Status: AC | PRN

## 2017-04-20 MED ORDER — IPRATROPIUM-ALBUTEROL 0.5-2.5 (3) MG/3ML IN SOLN: 3.0000 mL | RESPIRATORY_TRACT | 0 refills | Status: AC | PRN

## 2017-04-20 MED ORDER — DOCUSATE SODIUM 100 MG PO CAPS: 100.0000 mg | ORAL_CAPSULE | Freq: Every day | ORAL | 0 refills | Status: AC | PRN

## 2017-04-20 MED ORDER — CIPROFLOXACIN HCL 500 MG PO TABS: 500.0000 mg | ORAL_TABLET | Freq: Two times a day (BID) | ORAL | 0 refills | Status: AC

## 2017-04-20 MED ORDER — POTASSIUM CHLORIDE CRYS ER 20 MEQ PO TBCR
40.0000 meq | EXTENDED_RELEASE_TABLET | Freq: Once | ORAL | Status: AC
  Administered 2017-04-20: 40 meq via ORAL
  Filled 2017-04-20: qty 2

## 2017-04-20 NOTE — Progress Notes (Signed)
Pt from home with daughter Marylene Landngela and wants to return home. [facility] Pt only wants HH[facility] at this time. Request made for RW. Orders received and [facility] rep alerted of referral and orders. Sandford Crazeora Sirius Woodford RN,BSN,NCM [phone]

## 2017-04-20 NOTE — Clinical Social Work Note (Addendum)
Met with pt's daughter Carol Munoz at bedside. Confirms pt/family are not interested in SNF and planning for pt to return home. Request referral to [facility]. State that they are working out to have family provide 24/7 supervision for the next several days post DC.   Clinical Social Work Assessment  Patient Details  Name: Carol Munoz MRN: [id number] Date of Birth: [birth year]  Date of referral:  04/20/17               Reason for consult:  Discharge Planning                Permission sought to share information with:  Family Supports Permission granted to share information::  Yes, Verbal Permission Granted  Name::     daughter Carol Munoz  Agency::     Relationship::     Contact Information:     Housing/Transportation Living arrangements for the past 2 months:  [place] of Information:  Patient, Medical Team Patient Interpreter Needed:  None Criminal Activity/Legal Involvement Pertinent to Current Situation/Hospitalization:  No - Comment as needed Significant Relationships:  Adult Children Lives with:  Adult Children Do you feel safe going back to the place where you live?  Yes Need for family participation in patient care:  Yes (Comment)(lives with daughter who is involved in care decisions)  Care giving concerns:  Pt admitted from home where she resides with her daughter.  Pt states at baseline she ambulates independently with a walker and "takes care of sweeping and cleaning up the house, usually is up and around." Currently pt needing assistance with ambulation  Facilities manager / plan:  CSW following to assist with disposition. Met with pt at bedside- pt explains she has been planning to return home at [place] with daughter. CSW explained that due to deconditioning, PT now recommending pt could benefit from SNF placement for rehab.  She states she is not interested in SNF and wants to return home and receive therapy "outpatient or at home if I can, that would be  fine." Patient consented for CSW to speak with pt's daugher re: care needs as she as in home with her and pt would like her input.  CSW left voicemail with pt's daughter Carol Munoz.  Plan: SNF placement versus return home with daughter. Awaiting contact with daughter to confirm pt's wish to come home is feasible.   Employment status:  Retired Nurse, adult PT Recommendations:  [place], [place] / Referral to community resources:     Patient/Family's Response to care:  Pt appreciative of care, expresses thanks  Patient/Family's Understanding of and Emotional Response to Diagnosis, Current Treatment, and Prognosis:  Pt demonstrates understanding of potential plans but minimally acknowledges barrier to returning home. Emotionally appropriate. "I have family members who have gone to rehab. I'm not interested in that, I want to get well at home"  Emotional Assessment Appearance:  Appears younger than stated age Attitude/Demeanor/Rapport:  Engaged(pleasant) Affect (typically observed):  Calm Orientation:  Oriented to Self, Oriented to Place, Oriented to Situation, Oriented to  Time Alcohol / Substance use:  Not Applicable Psych involvement (Current and /or in the community):  No (Comment)  Discharge Needs  Concerns to be addressed:  Decision making concerns, Discharge Planning Concerns Readmission within the last 30 days:  No Current discharge risk:  (still assessing) Barriers to Discharge:  (Still assessing)   Nila Nephew, LCSW 04/20/2017, 10:30 AM  [phone]

## 2017-04-20 NOTE — Progress Notes (Signed)
Discharge instructions reviewed with patient and daughter. Daughter verbalized understanding. Patient to be d/c via private vehicle with daughter.

## 2017-04-20 NOTE — Discharge Instructions (Signed)

## 2017-04-20 NOTE — Discharge Summary (Signed)
Physician Discharge Summary  Carol Munoz JXB:[id number] DOB: January 06, 1925 DOA: 04/15/2017  PCP: Patient, No Pcp Per  Admit date: 04/15/2017 Discharge date: 04/20/2017  Recommendations for Outpatient Follow-up:  Continue Cipro for 10 days on discharge  Follow up CXR in 2-4 week to ensure resolution of pneumonia  Discharge Diagnoses:  Active Problems:   CAP (community acquired pneumonia)   Pneumonia    Discharge Condition: stable   Diet recommendation: as tolerated   History of present illness:  84 y.o.femalepast medical history negative for hyperthyroid, glaucoma and arthritis presents to the emergency room room with weakness. She had sudden onset weakness for 1 day. No cough, congestion, fever, chills, nausea, vomiting, diarrhea.  Pt found to have pneumonia on CXR and was started on empiric abx while awaiting culture results.   Hospital Course:    Assessment & Plan:   Active Problems:   CAP (community acquired pneumonia) / Leukocytosis  - CXR on admission showed right middle lobe pneumonia, possible chronic infection. Bibasilar atelectasis/scarring. Followup PA and lateral chest X-ray is recommended in 3-4 weeks following trial of antibiotic therapy to ensure resolution and exclude underlying malignancy. Tracheal displacement and narrowing concerning for mediastinal mass, possible substernal thyroid. Consider CT chest with contrast on a nonemergent basis - Stop azithromycin and Rocephin  - Cipro on discharge for 10 days    E.Coli bacteremia - Cipro for 10 more days on discharge     Hypokalemia - Supplemented prior to discharge     Essential hypertension - Continue home meds     Hyperthyroidism - Continue methimazole     DVT prophylaxis: Lovenox subcutaneous Code Status: full code  Family Communication: no family at bedside     Consultants:   PT - HH  Procedures:   None     Signed:  Manson Passey, MD  [facility] 04/20/2017, 9:30  AM  Pager #: [phone]  Time spent in minutes: more than 30 minutes  Discharge Exam: Vitals:   04/19/17 2100 04/20/17 0450  BP: (!) 106/43 (!) 119/52  Pulse: 72 72  Resp: 18 18  Temp: 98.6 F (37 C) 98.9 F (37.2 C)  SpO2: 95% 97%   Vitals:   04/19/17 1029 04/19/17 1527 04/19/17 2100 04/20/17 0450  BP: (!) 101/43 (!) 114/51 (!) 106/43 (!) 119/52  Pulse:  72 72 72  Resp:  20 18 18   Temp:  98.9 F (37.2 C) 98.6 F (37 C) 98.9 F (37.2 C)  TempSrc:  Oral Oral Oral  SpO2:  98% 95% 97%    General: Pt is alert, not in acute distress Cardiovascular: Regular rate and rhythm, S1/S2 + Respiratory: no wheezing, no crackles, no rhonchi Abdominal: Soft, non tender, non distended, bowel sounds +, no guarding Extremities:  no cyanosis, pulses palpable bilaterally DP and PT Neuro: Grossly nonfocal  Discharge Instructions  Discharge Instructions    Call MD for:  persistant nausea and vomiting   Complete by:  As directed    Call MD for:  redness, tenderness, or signs of infection (pain, swelling, redness, odor or green/yellow discharge around incision site)   Complete by:  As directed    Call MD for:  severe uncontrolled pain   Complete by:  As directed    Diet - low sodium heart healthy   Complete by:  As directed    Discharge instructions   Complete by:  As directed    Continue Cipro for 10 days on discharge  Follow up CXR in 2-4 week to ensure resolution  of pneumonia   Increase activity slowly   Complete by:  As directed      Allergies as of 04/20/2017   No Known Allergies     Medication List    STOP taking these medications   lisinopril 10 MG tablet Commonly known as:  PRINIVIL     TAKE these medications   acetaminophen 325 MG tablet Commonly known as:  TYLENOL Take 2 tablets (650 mg total) by mouth every 6 (six) hours as needed for mild pain (or Fever >/= 101).   amLODipine 10 MG tablet Commonly known as:  NORVASC Take 1 tablet (10 mg total) by mouth  daily.   brimonidine 0.1 % Soln Commonly known as:  ALPHAGAN P Place 1 drop into both eyes 2 (two) times daily.   carboxymethylcellulose 0.5 % Soln Commonly known as:  REFRESH PLUS Place 1 drop into both eyes 3 (three) times daily as needed.   ciprofloxacin 500 MG tablet Commonly known as:  CIPRO Take 1 tablet (500 mg total) by mouth 2 (two) times daily.   docusate sodium 100 MG capsule Commonly known as:  COLACE Take 1 capsule (100 mg total) by mouth daily as needed for mild constipation.   dorzolamide-timolol 22.3-6.8 MG/ML ophthalmic solution Commonly known as:  COSOPT Place 1 drop into both eyes 2 (two) times daily.   ipratropium-albuterol 0.5-2.5 (3) MG/3ML Soln Commonly known as:  DUONEB Take 3 mLs by nebulization every 4 (four) hours as needed.   Latanoprost 0.005 % Emul Place 1 drop into both eyes at bedtime.   losartan-hydrochlorothiazide 100-25 MG tablet Commonly known as:  HYZAAR Take 1 tablet by mouth daily.   methimazole 5 MG tablet Commonly known as:  TAPAZOLE Take 2.5 mg by mouth 2 (two) times daily.   Timolol Maleate 0.5 % (DAILY) Soln Apply 1 drop to eye 2 (two) times daily. 1 drop into LEFT eye            Durable Medical Equipment  (From admission, onward)        Start     Ordered   04/20/17 0928  For home use only DME Walker rolling  [facility](Walkers)  Once    Question:  Patient needs a walker to treat with the following condition  Answer:  Weakness   04/20/17 0929        The results of significant diagnostics from this hospitalization (including imaging, microbiology, ancillary and laboratory) are listed below for reference.    Significant Diagnostic Studies: Dg Chest 2 View  Result Date: 04/15/2017 CLINICAL DATA:  Dry cough, intermittent weakness. EXAM: CHEST  2 VIEW COMPARISON:  None. FINDINGS: Cardiac silhouette is mildly enlarged. Tortuous calcified aorta. Patchy RIGHT middle lobe airspace opacity with bibasilar strandy densities. No  pleural effusion. No pneumothorax. Trachea displaced to the RIGHT, mildly narrowed. Soft tissue planes and included osseous structures are nonacute. Mild degenerative change of the spine. IMPRESSION: RIGHT middle lobe pneumonia, possible chronic infection. Bibasilar atelectasis/scarring. Followup PA and lateral chest X-ray is recommended in 3-4 weeks following trial of antibiotic therapy to ensure resolution and exclude underlying malignancy. Tracheal displacement and narrowing concerning for mediastinal mass, possible substernal thyroid. Consider CT chest with contrast on a nonemergent basis. Aortic Atherosclerosis (ICD10-I70.0). Electronically Signed   By: Awilda Metroourtnay  Bloomer M.D.   On: 04/15/2017 18:04    Microbiology: Recent Results (from the past 240 hour(s))  Blood culture (routine x 2)     Status: Abnormal   Collection Time: 04/15/17  5:00 PM  Result  Value Ref Range Status   Specimen Description   Final    BLOOD LEFT ANTECUBITAL Performed at [facility], [address], [place], [place] [address]    Special Requests   Final    BOTTLES DRAWN AEROBIC AND ANAEROBIC Blood Culture adequate volume Performed at [facility], [address] [place], [place] [address]    Culture  Setup Time   Final    GRAM NEGATIVE RODS AEROBIC BOTTLE ONLY CRITICAL VALUE NOTED.  VALUE IS CONSISTENT WITH PREVIOUSLY REPORTED AND CALLED VALUE.    Culture (A)  Final    ESCHERICHIA COLI SUSCEPTIBILITIES PERFORMED ON PREVIOUS CULTURE WITHIN THE LAST 5 DAYS. Performed at [facility], [address] [place], [place] [address]    Report Status 04/18/2017 FINAL  Final  Blood culture (routine x 2)     Status: Abnormal   Collection Time: 04/15/17  5:04 PM  Result Value Ref Range Status   Specimen Description   Final    BLOOD LEFT HAND Performed at [facility], [address] [place], [place] [address]    Special Requests   Final    IN PEDIATRIC  BOTTLE Blood Culture adequate volume Performed at [facility], [address], [place], [place] [address]    Culture  Setup Time   Final    GRAM NEGATIVE RODS IN PEDIATRIC BOTTLE CRITICAL RESULT CALLED TO, READ BACK BY AND VERIFIED WITH: M LILLISTON,PHARMD AT 1010 04/16/17 BY L BENFIELD Performed at [facility], [address] [place], [place] [address]    Culture ESCHERICHIA COLI (A)  Final   Report Status 04/18/2017 FINAL  Final   Organism ID, Bacteria ESCHERICHIA COLI  Final      Susceptibility   Escherichia coli - MIC*    AMPICILLIN >=32 RESISTANT Resistant     CEFAZOLIN >=64 RESISTANT Resistant     CEFEPIME <=1 SENSITIVE Sensitive     CEFTAZIDIME 4 SENSITIVE Sensitive     CEFTRIAXONE <=1 SENSITIVE Sensitive     CIPROFLOXACIN <=0.25 SENSITIVE Sensitive     GENTAMICIN <=1 SENSITIVE Sensitive     IMIPENEM <=0.25 SENSITIVE Sensitive     TRIMETH/SULFA <=20 SENSITIVE Sensitive     AMPICILLIN/SULBACTAM >=32 RESISTANT Resistant     PIP/TAZO >=128 RESISTANT Resistant     Extended ESBL NEGATIVE Sensitive     * ESCHERICHIA COLI  Blood Culture ID Panel (Reflexed)     Status: Abnormal   Collection Time: 04/15/17  5:04 PM  Result Value Ref Range Status   Enterococcus species NOT DETECTED NOT DETECTED Final   Listeria monocytogenes NOT DETECTED NOT DETECTED Final   Staphylococcus species NOT DETECTED NOT DETECTED Final   Staphylococcus aureus NOT DETECTED NOT DETECTED Final   Streptococcus species NOT DETECTED NOT DETECTED Final   Streptococcus agalactiae NOT DETECTED NOT DETECTED Final   Streptococcus pneumoniae NOT DETECTED NOT DETECTED Final   Streptococcus pyogenes NOT DETECTED NOT DETECTED Final   Acinetobacter baumannii NOT DETECTED NOT DETECTED Final   Enterobacteriaceae species DETECTED (A) NOT DETECTED Final    Comment: Enterobacteriaceae represent a large family of gram-negative bacteria, not a single organism. CRITICAL RESULT CALLED TO, READ BACK BY  AND VERIFIED WITH: M LILLISTON,PHARMD AT 1010 04/16/17 BY L BENFIELD    Enterobacter cloacae complex NOT DETECTED NOT DETECTED Final   Escherichia coli DETECTED (A) NOT DETECTED Final    Comment: CRITICAL RESULT CALLED TO, READ BACK BY AND VERIFIED WITH: M LILLISTON,PHARMD  AT 1010 04/16/17 BY L BENFIELD    Klebsiella oxytoca NOT DETECTED NOT DETECTED Final   Klebsiella pneumoniae NOT DETECTED NOT DETECTED Final   Proteus species NOT DETECTED NOT DETECTED Final   Serratia marcescens NOT DETECTED NOT DETECTED Final   Carbapenem resistance NOT DETECTED NOT DETECTED Final   Haemophilus influenzae NOT DETECTED NOT DETECTED Final   Neisseria meningitidis NOT DETECTED NOT DETECTED Final   Pseudomonas aeruginosa NOT DETECTED NOT DETECTED Final   Candida albicans NOT DETECTED NOT DETECTED Final   Candida glabrata NOT DETECTED NOT DETECTED Final   Candida krusei NOT DETECTED NOT DETECTED Final   Candida parapsilosis NOT DETECTED NOT DETECTED Final   Candida tropicalis NOT DETECTED NOT DETECTED Final    Comment: Performed at [facility], [address] [place], [place] [address]  Urine Culture     Status: Abnormal   Collection Time: 04/15/17  5:23 PM  Result Value Ref Range Status   Specimen Description   Final    URINE, RANDOM Performed at [facility], [address] [place], [place] [address]    Special Requests   Final    NONE Performed at [facility], [address] [place], [place] [address]    Culture MULTIPLE SPECIES PRESENT, SUGGEST RECOLLECTION (A)  Final   Report Status 04/17/2017 FINAL  Final     Labs: Basic Metabolic Panel: Recent Labs  Lab 04/16/17 0333 04/17/17 0338 04/18/17 0349 04/19/17 0341 04/20/17 0353  NA 135 131* 131* 135 136  K 3.1* 2.6* 2.8* 3.7 3.4*  CL 102 99* 99* 106 105  CO2 25 22 23 24 22   GLUCOSE 169* 115* 94 101* 106*  BUN 14 13 20  21* 15  CREATININE 0.73 0.77 0.97 0.88 0.65  CALCIUM 8.4* 8.0* 8.1*  7.9* 8.1*  MG  --   --   --  2.0  --    Liver Function Tests: Recent Labs  Lab 04/15/17 1705  AST 118*  ALT 151*  ALKPHOS 111  BILITOT 2.3*  PROT 6.9  ALBUMIN 3.4*   No results for input(s): LIPASE, AMYLASE in the last 168 hours. No results for input(s): AMMONIA in the last 168 hours. CBC: Recent Labs  Lab 04/15/17 1705 04/16/17 0333 04/17/17 0338 04/18/17 0349 04/19/17 0341 04/20/17 0353  WBC 7.6 12.6* 13.5* 10.3 9.5 9.6  NEUTROABS 6.6  --   --   --   --   --   HGB 11.3* 10.2* 11.1* 10.9* 10.2* 11.2*  HCT 34.8* 31.2* 33.3* 32.5* 29.7* 33.9*  MCV 81.3 80.2 78.7 78.1 78.0 79.0  PLT 167 158 173 194 208 263   Cardiac Enzymes: Recent Labs  Lab 04/15/17 1705  TROPONINI <0.03   BNP: BNP (last 3 results) No results for input(s): BNP in the last 8760 hours.  ProBNP (last 3 results) No results for input(s): PROBNP in the last 8760 hours.  CBG: No results for input(s): GLUCAP in the last 168 hours.

## 2017-05-05 ENCOUNTER — Other Ambulatory Visit: Payer: Self-pay | Admitting: Physician Assistant

## 2017-05-05 DIAGNOSIS — J398 Other specified diseases of upper respiratory tract: Secondary | ICD-10-CM

## 2017-05-26 ENCOUNTER — Other Ambulatory Visit: Payer: Medicare Other

## 2017-06-01 ENCOUNTER — Ambulatory Visit
Admission: RE | Admit: 2017-06-01 | Discharge: 2017-06-01 | Disposition: A | Discharge status: Home / Self Care | Payer: Medicare Other | Source: Ambulatory Visit | Attending: Physician Assistant | Admitting: Physician Assistant

## 2017-06-01 DIAGNOSIS — J398 Other specified diseases of upper respiratory tract: Secondary | ICD-10-CM

## 2017-06-01 MED ORDER — IOPAMIDOL (ISOVUE-300) INJECTION 61%
75.0000 mL | Freq: Once | INTRAVENOUS | Status: AC | PRN
  Administered 2017-06-01: 75 mL via INTRAVENOUS

## 2017-07-21 ENCOUNTER — Other Ambulatory Visit: Payer: Self-pay | Admitting: Physician Assistant

## 2017-07-21 ENCOUNTER — Ambulatory Visit
Admission: RE | Admit: 2017-07-21 | Discharge: 2017-07-21 | Disposition: A | Discharge status: Home / Self Care | Payer: Medicare Other | Source: Ambulatory Visit | Attending: Physician Assistant | Admitting: Physician Assistant

## 2017-07-21 DIAGNOSIS — J189 Pneumonia, unspecified organism: Secondary | ICD-10-CM

## 2019-05-06 IMAGING — CR DG CHEST 2V
2 series · 2 of 2 positions shown · non-contrast
Comparison: None.

CLINICAL DATA: Dry cough, intermittent weakness.

EXAM:
CHEST  2 VIEW

[w chest lat]
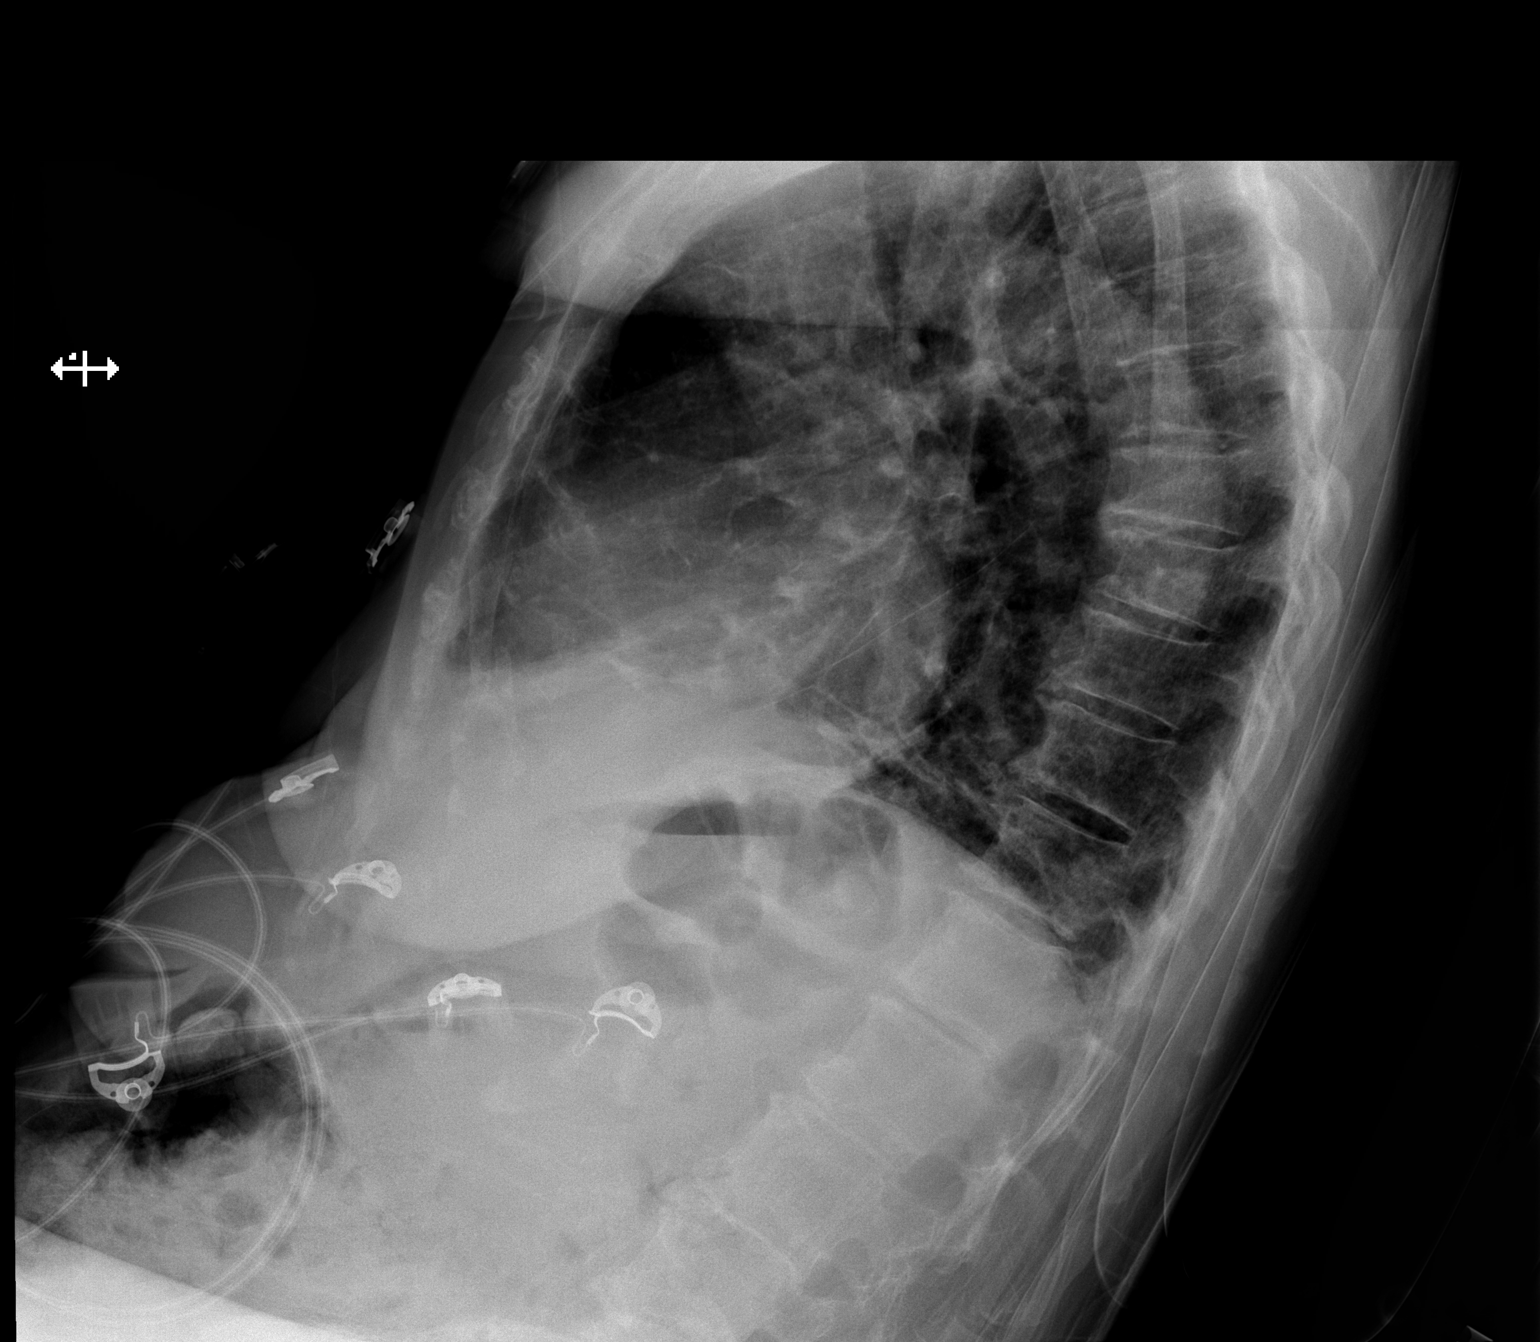

[x chest ap]
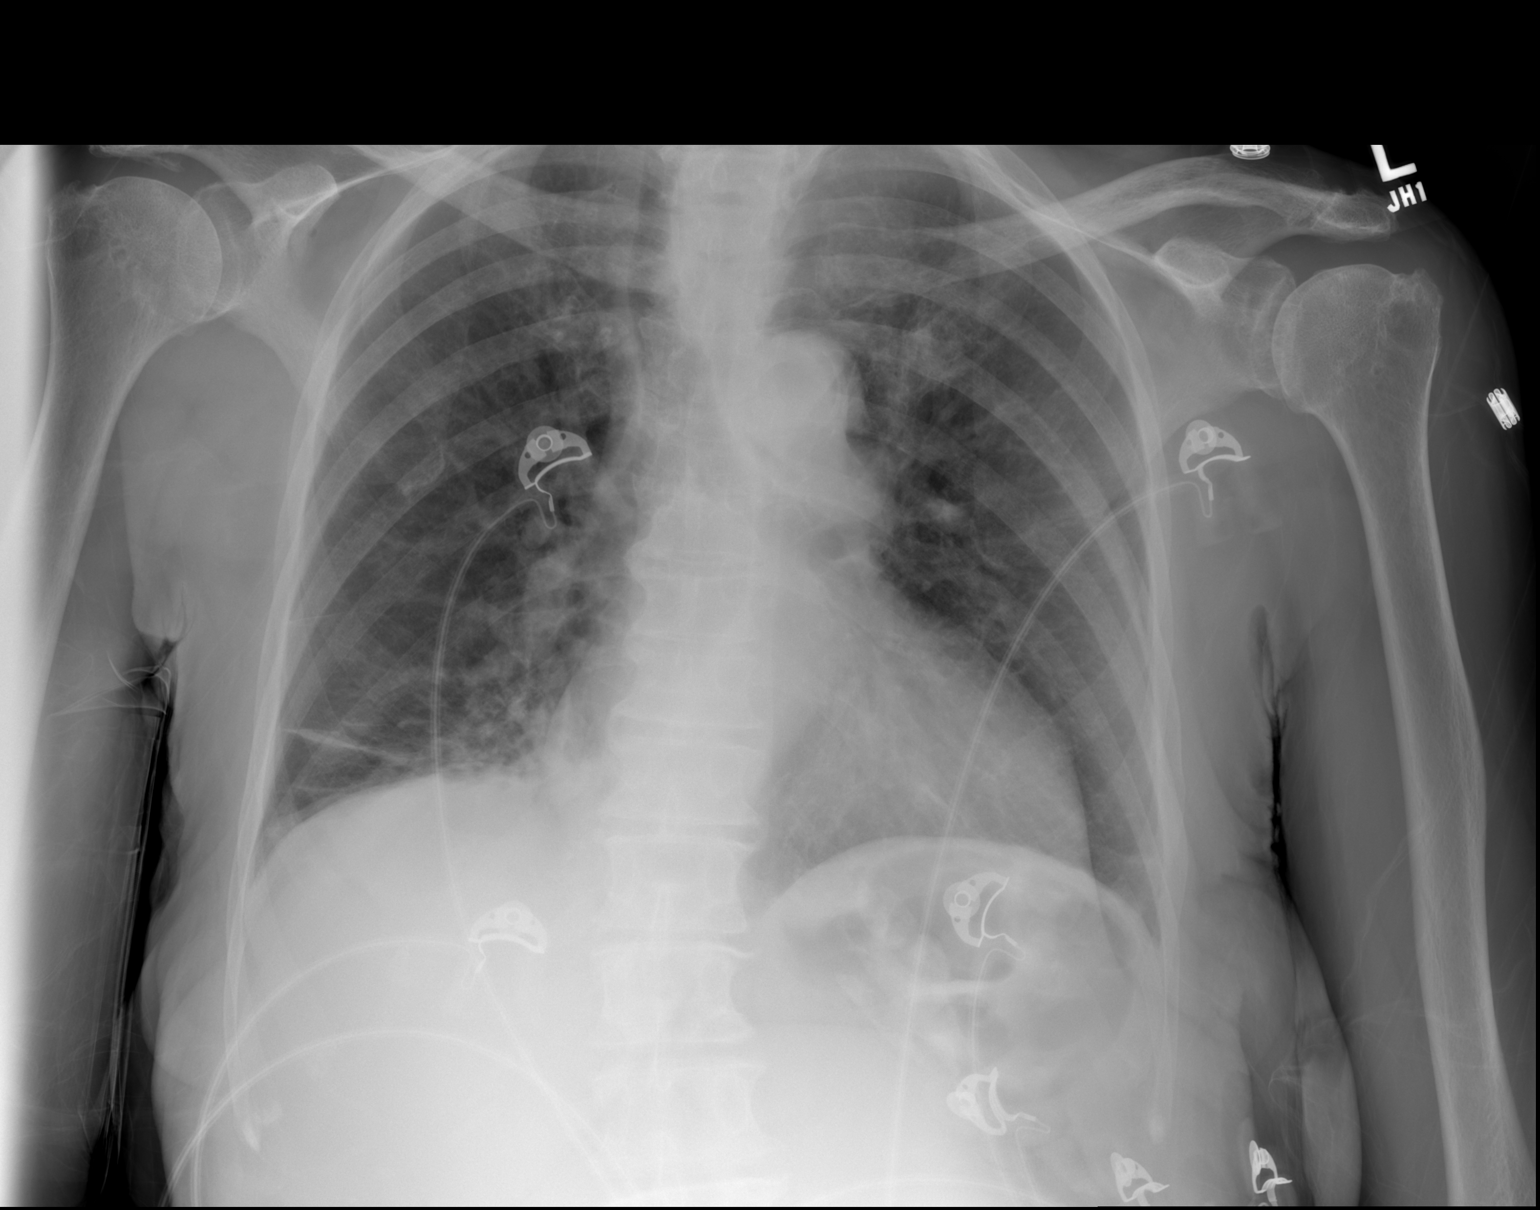

[2 of 2 positions shown; findings below may reference images not displayed]

FINDINGS: Cardiac silhouette is mildly enlarged. Tortuous calcified aorta.
Patchy RIGHT middle lobe airspace opacity with bibasilar strandy
densities. No pleural effusion. No pneumothorax. Trachea displaced
to the RIGHT, mildly narrowed. Soft tissue planes and included
osseous structures are nonacute. Mild degenerative change of the
spine.
IMPRESSION: RIGHT middle lobe pneumonia, possible chronic infection. Bibasilar
atelectasis/scarring. Followup PA and lateral chest X-ray is
recommended in 3-4 weeks following trial of antibiotic therapy to
ensure resolution and exclude underlying malignancy.

Tracheal displacement and narrowing concerning for mediastinal mass,
possible substernal thyroid. Consider CT chest with contrast on a
nonemergent basis.

Aortic Atherosclerosis ([id number].R).

## 2021-04-01 ENCOUNTER — Other Ambulatory Visit: Payer: Self-pay | Admitting: Physician Assistant

## 2021-04-01 DIAGNOSIS — R221 Localized swelling, mass and lump, neck: Secondary | ICD-10-CM

## 2021-04-03 ENCOUNTER — Other Ambulatory Visit: Payer: Medicare Other

## 2021-04-09 ENCOUNTER — Other Ambulatory Visit: Payer: Self-pay | Admitting: Physician Assistant

## 2021-04-09 ENCOUNTER — Ambulatory Visit
Admission: RE | Admit: 2021-04-09 | Discharge: 2021-04-09 | Disposition: A | Discharge status: Home / Self Care | Payer: Medicare Other | Source: Ambulatory Visit | Attending: Physician Assistant | Admitting: Physician Assistant

## 2021-04-09 DIAGNOSIS — R221 Localized swelling, mass and lump, neck: Secondary | ICD-10-CM

## 2022-12-20 ENCOUNTER — Encounter (HOSPITAL_BASED_OUTPATIENT_CLINIC_OR_DEPARTMENT_OTHER): Payer: Self-pay

## 2022-12-20 ENCOUNTER — Emergency Department (HOSPITAL_BASED_OUTPATIENT_CLINIC_OR_DEPARTMENT_OTHER): Payer: Medicare Other

## 2022-12-20 ENCOUNTER — Other Ambulatory Visit: Payer: Self-pay

## 2022-12-20 ENCOUNTER — Emergency Department (HOSPITAL_BASED_OUTPATIENT_CLINIC_OR_DEPARTMENT_OTHER)
Admission: EM | Admit: 2022-12-20 | Discharge: 2022-12-20 | Disposition: A | Discharge status: Home / Self Care | Payer: Medicare Other | Attending: Emergency Medicine | Admitting: Emergency Medicine

## 2022-12-20 DIAGNOSIS — W06XXXA Fall from bed, initial encounter: Secondary | ICD-10-CM | POA: Insufficient documentation

## 2022-12-20 DIAGNOSIS — M25561 Pain in right knee: Secondary | ICD-10-CM | POA: Insufficient documentation

## 2022-12-20 HISTORY — DX: Thyrotoxicosis, unspecified without thyrotoxic crisis or storm: E05.90

## 2022-12-20 HISTORY — DX: Essential (primary) hypertension: I10

## 2022-12-20 HISTORY — DX: Pure hypercholesterolemia, unspecified: E78.00

## 2022-12-20 NOTE — Discharge Instructions (Addendum)
The workup in the emergency room is overall reassuring.  We have ordered an outpatient ultrasound DVT study tomorrow that we will rule out a blood clot in the leg and any cyst in the back of the knee.  We recommend that you apply ice to your knee 3 or 4 times a day for about 10 minutes.   You may consider buying a knee sleeve from drugstore such as [employer], [employer], [employer] for extra support to that knee.  Follow-up with your primary care doctor in 7 to 10 days.

## 2022-12-20 NOTE — ED Notes (Signed)
Pt left prior to receiving dc paperwork

## 2022-12-20 NOTE — ED Provider Notes (Signed)
[place] EMERGENCY DEPARTMENT AT [facility] Provider Note   CSN: [id number] Arrival date & time: 12/20/22  1227     History {Add pertinent medical, surgical, social history, OB history to HPI:1} Chief Complaint  Patient presents with   Carol Munoz is a 89 y.o. female.  HPI    89 year old female comes in with chief complaint of fall. Patient accompanied by her granddaughter.  According to the granddaughter, patient had slipped from her bed and fell onto her buttock.  Patient was unable to get up and called her granddaughter who went to help her.  Patient indicates that she never hit her head and simply just slid onto her buttock.  She just could not get up and had to call for help.  Patient has been having some knee pain for the last 2 weeks, family noted that she had some swelling and decided to bring her into the ER.  Patient denies any new knee pain, but states that the pain has been bothering her for the last few days.  She denies any trauma that she can recall.  She thinks that the discomfort she is having now was present even before she fell.  Home Medications Prior to Admission medications   Medication Sig Start Date End Date Taking? Authorizing Provider  acetaminophen (TYLENOL) 325 MG tablet Take 2 tablets (650 mg total) by mouth every 6 (six) hours as needed for mild pain (or Fever >/= 101). 04/20/17   Alison Murray, MD  amLODipine (NORVASC) 10 MG tablet Take 1 tablet (10 mg total) by mouth daily. 11/27/11 04/15/17  Laveda Norman, MD  brimonidine (ALPHAGAN P) 0.1 % SOLN Place 1 drop into both eyes 2 (two) times daily.    [provider]  carboxymethylcellulose (REFRESH PLUS) 0.5 % SOLN Place 1 drop into both eyes 3 (three) times daily as needed.    [provider]  ciprofloxacin (CIPRO) 500 MG tablet Take 1 tablet (500 mg total) by mouth 2 (two) times daily. 04/20/17   Alison Murray, MD  docusate sodium (COLACE) 100 MG capsule Take 1 capsule  (100 mg total) by mouth daily as needed for mild constipation. 04/20/17   Alison Murray, MD  dorzolamide-timolol (COSOPT) 22.3-6.8 MG/ML ophthalmic solution Place 1 drop into both eyes 2 (two) times daily.    [provider]  ipratropium-albuterol (DUONEB) 0.5-2.5 (3) MG/3ML SOLN Take 3 mLs by nebulization every 4 (four) hours as needed. 04/20/17   Alison Murray, MD  Latanoprost 0.005 % EMUL Place 1 drop into both eyes at bedtime. 08/12/16   [provider]  losartan-hydrochlorothiazide (HYZAAR) 100-25 MG tablet Take 1 tablet by mouth daily. 11/20/16   [provider]  methimazole (TAPAZOLE) 5 MG tablet Take 2.5 mg by mouth 2 (two) times daily. 11/20/16   [provider]  Timolol Maleate 0.5 % (DAILY) SOLN Apply 1 drop to eye 2 (two) times daily. 1 drop into LEFT eye    [provider]      Allergies    Patient has no known allergies.    Review of Systems   Review of Systems  All other systems reviewed and are negative.   Physical Exam Updated Vital Signs BP (!) 120/53 (BP Location: Left Arm)   Pulse 72   Temp 97.7 F (36.5 C) (Temporal)   Resp 20   Ht 5\' 3"  (1.6 m)   Wt 51.3 kg   SpO2 99%   BMI 20.02  kg/m  Physical Exam Vitals and nursing note reviewed.  Constitutional:      Appearance: She is well-developed.  HENT:     Head: Atraumatic.  Cardiovascular:     Rate and Rhythm: Normal rate.  Pulmonary:     Effort: Pulmonary effort is normal.  Musculoskeletal:        General: Swelling and tenderness present. No deformity.     Cervical back: Normal range of motion and neck supple.     Left lower leg: Edema present.     Comments: Patient has valgus deviation of the RLE  Skin:    General: Skin is warm and dry.  Neurological:     Mental Status: She is alert and oriented to person, place, and time.     ED Results / Procedures / Treatments   Labs (all labs ordered are listed, but only abnormal results are displayed) Labs Reviewed - No  data to display  EKG None  Radiology No results found.  Procedures Procedures  {Document cardiac monitor, telemetry assessment procedure when appropriate:1}  Medications Ordered in ED Medications - No data to display  ED Course/ Medical Decision Making/ A&P   {   Click here for ABCD2, HEART and other calculatorsREFRESH Note before signing :1}                              Medical Decision Making Amount and/or Complexity of Data Reviewed Radiology: ordered.  89 year old female comes in with chief complaint of leg swelling and pain.  Differential diagnosis for her includes knee fracture, severe knee arthritis, DVT, Baker's cyst.  She clearly has some valgus deviation of the right lower extremity, but it is not clear if its new.  X-ray of the knee ordered.  Patient does not have any hip tenderness.  If the workup in the ER is reassuring, patient will be discharged with PCP follow-up and outpatient ultrasound DVT ordered.  Final Clinical Impression(s) / ED Diagnoses Final diagnoses:  None    Rx / DC Orders ED Discharge Orders     None

## 2022-12-20 NOTE — ED Notes (Signed)
Pt states left knee pain is chronic and unchanged from its baseline.  Pt reports Right knee pain worse since fall out of bed this morning.

## 2022-12-20 NOTE — ED Triage Notes (Addendum)
Pt presents with injuries from a fall. Pt states she was sitting on the edge of the bed and slide out onto her buttock. Pt c/o L knee pain and RLE swelling. Denies anticoagulant use. Pt states the RLE edema was present before the fall and pt was supposed to have an appointment with her PCP this upcoming week.

## 2022-12-20 NOTE — ED Notes (Signed)
Pt taken out by family in wheelchair.

## 2022-12-20 NOTE — ED Provider Notes (Signed)
  Physical Exam  BP (!) 120/53 (BP Location: Left Arm)   Pulse 72   Temp 97.7 F (36.5 C) (Temporal)   Resp 20   Ht 5\' 3"  (1.6 m)   Wt 51.3 kg   SpO2 99%   BMI 20.02 kg/m   Physical Exam  Procedures  Procedures  ED Course / MDM   Clinical Course as of 12/20/22 1540  Sun Dec 20, 2022  1539 Assumed care from Dr Rhunette Croft. 89 yo F who presents with fall. Knee pain x2 weeks. Slipped and fell on buttocks when trying to get out of bed today. Has some valgus deviation with ttp. Appears swollen. If the x-ray is negative and she can bear weight she can go home.  [RP]    Clinical Course User Index [RP] Rondel Baton, MD   Medical Decision Making Amount and/or Complexity of Data Reviewed Radiology: ordered.   X-ray showed arthritis without fracture. Pain controlled at this time. Patient able to fully range knee. No warmth or erythma to suggest infection. Will have her fu with pcp for continued evaluation and return tm for DVT US.     Rondel Baton, MD 12/23/22 [phone]

## 2023-04-30 IMAGING — US US THYROID
1 series · 13 of 25 positions shown · non-contrast
Comparison: None.

CLINICAL DATA: Left neck swelling

EXAM:
THYROID ULTRASOUND
TECHNIQUE: Ultrasound examination of the thyroid gland and adjacent soft
tissues was performed.

[Series 1: us thyroid · 0.07mm/px · 13 of 43 slices shown]
[im 1/43]
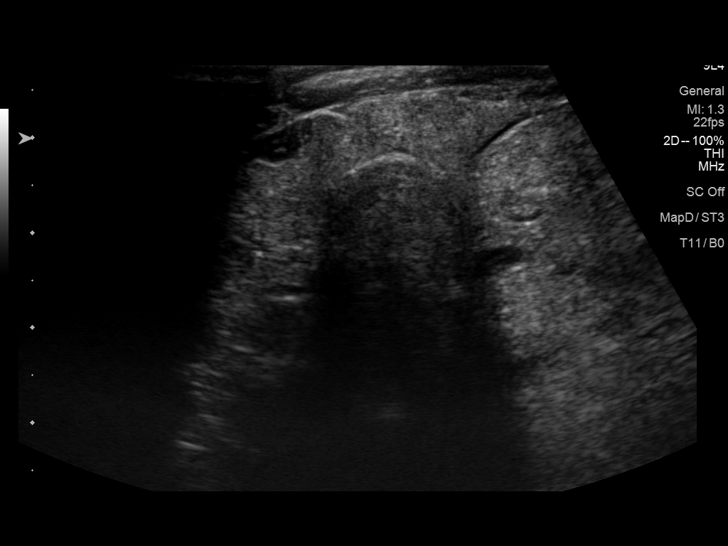
[im 4/43]
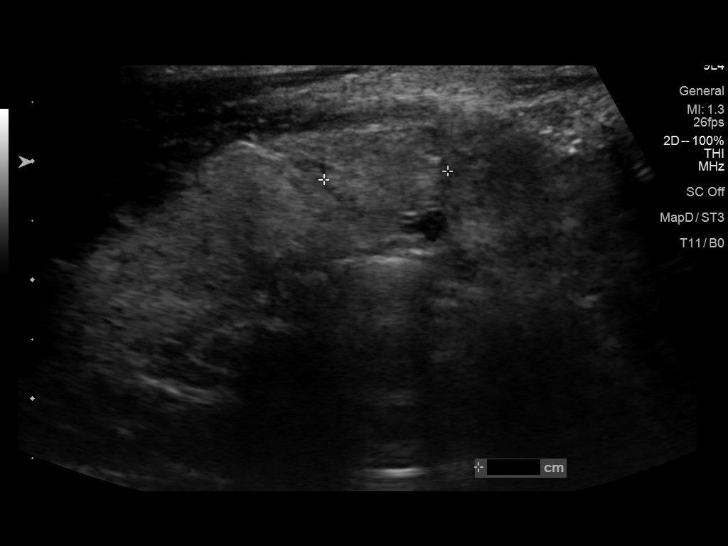
[im 8/43]
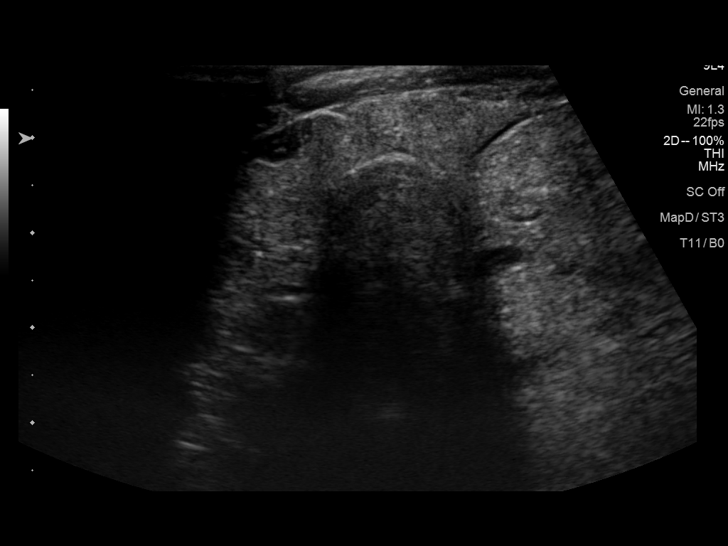
[im 11/43]
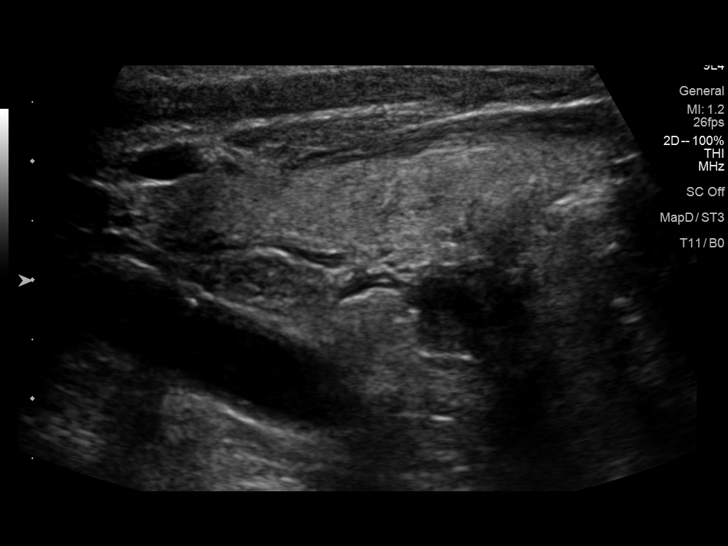
[im 15/43]
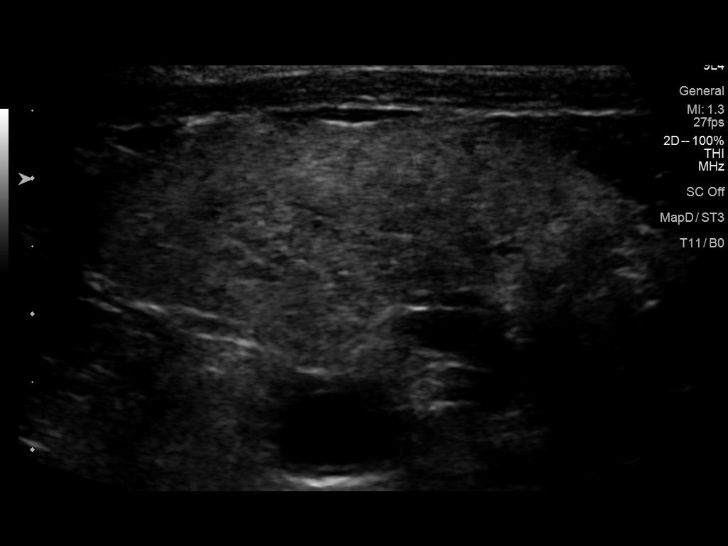
[im 18/43]
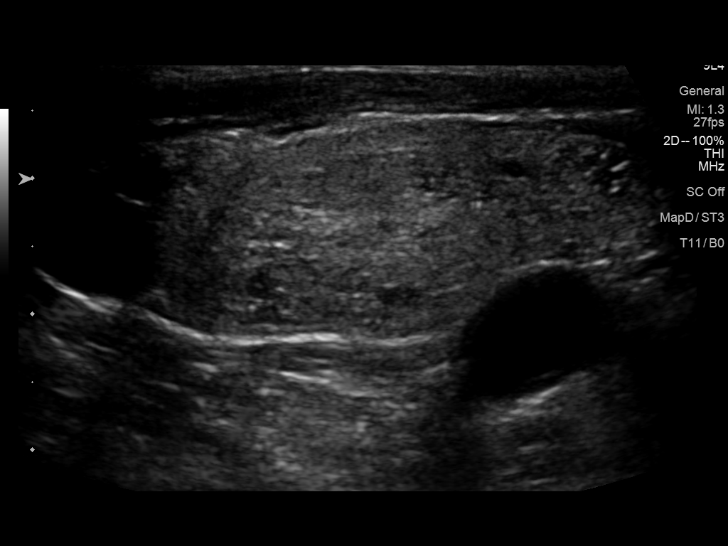
[im 22/43]
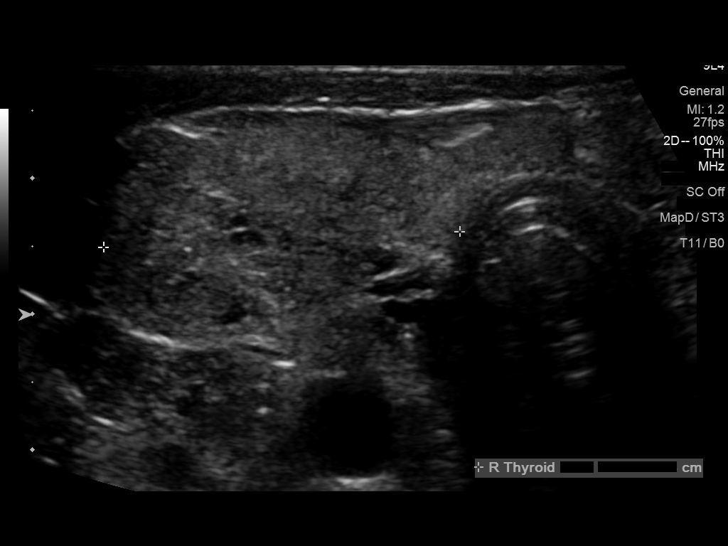
[im 25/43]
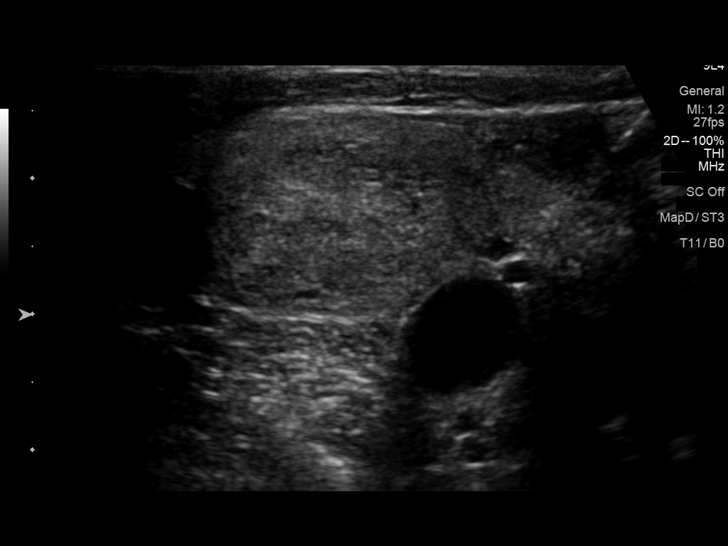
[im 29/43]
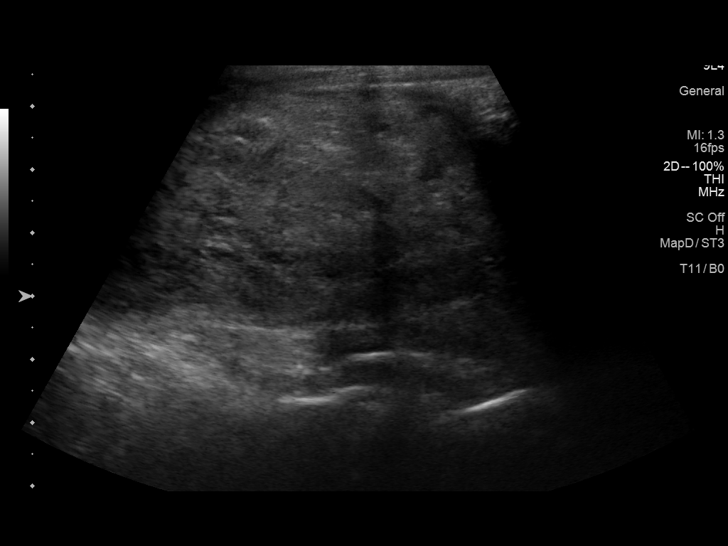
[im 32/43]
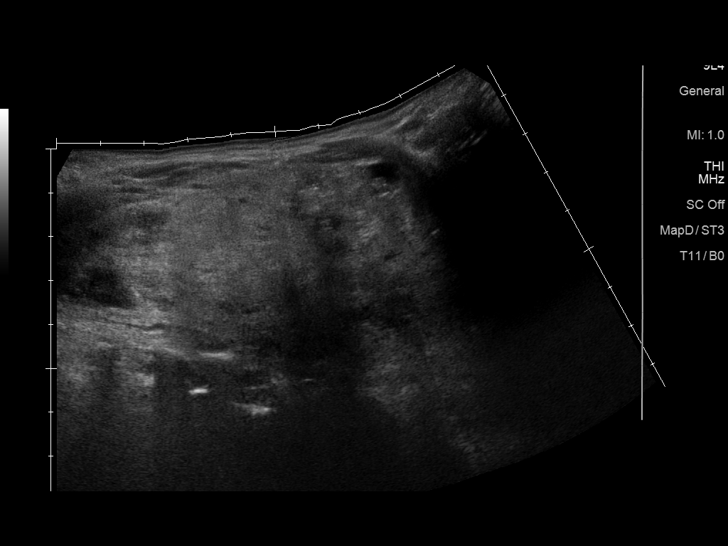
[im 36/43]
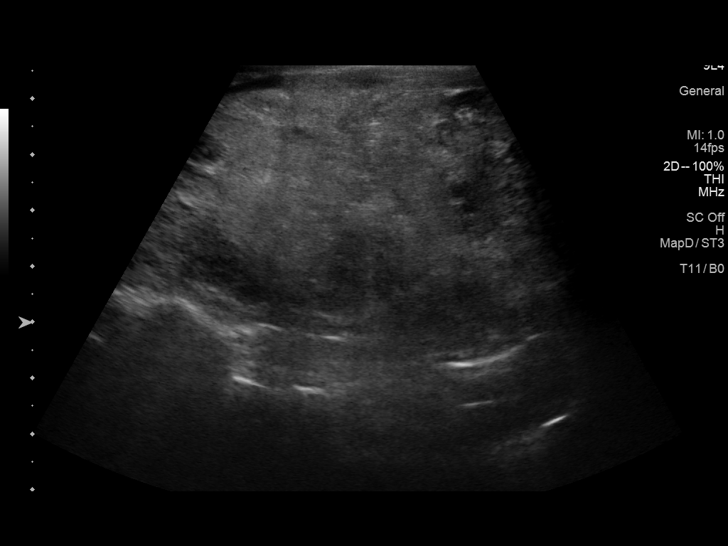
[im 39/43]
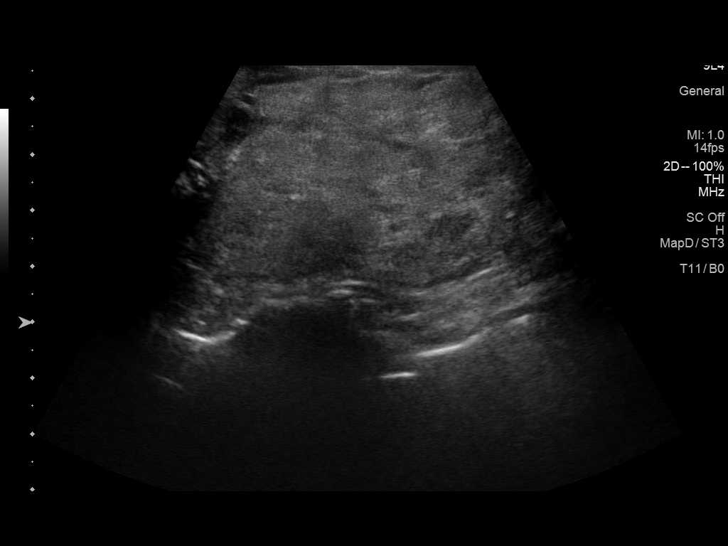
[im 43/43]
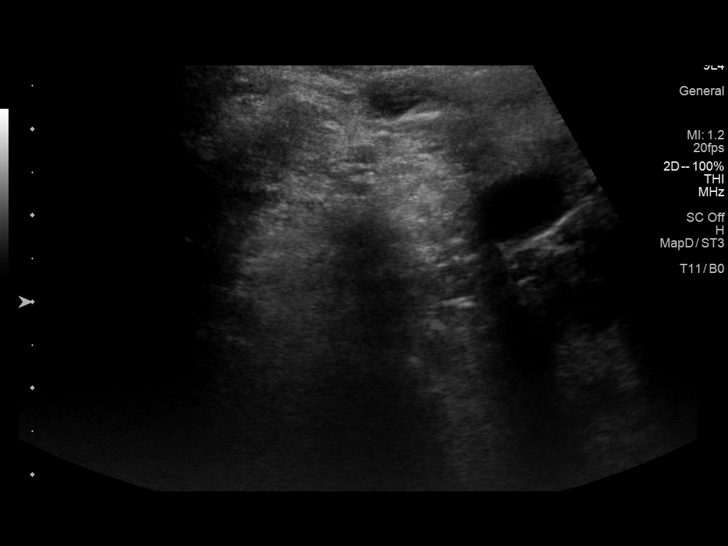

[13 of 25 positions shown; findings below may reference images not displayed]

FINDINGS: Parenchymal Echotexture: Moderately heterogeneous

Isthmus: 0.6 cm

Right lobe: 4.7 x 1.8 x 2.6 cm

Left lobe: 9.2 x 4.5 x 5.8 cm

_________________________________________________________

Estimated total number of nodules >/= 1 cm: 3

Number of spongiform nodules >/=  2 cm not described below (TR1): 0

Number of mixed cystic and solid nodules >/= 1.5 cm not described
below (TR2): 0

_________________________________________________________

Nodule # 1: 1.0 x 0.9 x 0.7 cm isoechoic region in the isthmus is
likely a pseudo nodule.

_________________________________________________________

Nodule # 2:

Location: Right; inferior

Maximum size: 2.5 cm; Other 2 dimensions: 1.8 x 1.5 cm

Composition: solid/almost completely solid (2)

Echogenicity: isoechoic (1)

Shape: not taller-than-wide (0)

Margins: ill-defined (0)

Echogenic foci: none (0)

ACR TI-RADS total points: 3.

ACR TI-RADS risk category: TR3 (3 points).

ACR TI-RADS recommendations:

**Given size (>/= 2.5 cm) and appearance, fine needle aspiration of
this mildly suspicious nodule should be considered based on TI-RADS
criteria.

_________________________________________________________

Nodule # 3:

Location: Left; mid

Maximum size: 4.7 cm; Other 2 dimensions: 2.6 x 1.8 cm

Composition: solid/almost completely solid (2)

Echogenicity: isoechoic (1)

Shape: not taller-than-wide (0)

Margins: ill-defined (0)

Echogenic foci: none (0)

ACR TI-RADS total points: 3.

ACR TI-RADS risk category: TR3 (3 points).

ACR TI-RADS recommendations:

**Given size (>/= 2.5 cm) and appearance, fine needle aspiration of
this mildly suspicious nodule should be considered based on TI-RADS
criteria.

_________________________________________________________
IMPRESSION: Nodules 2 and 3 are ACR TI-RADS category 3.

They both meet criteria for FNA.

The above is in keeping with the ACR TI-RADS recommendations - [HOSPITAL] 2580;[DATE].
